# Patient Record
Sex: Female | Born: 1975 | Race: White | Hispanic: No | Marital: Married | Smoking: Former smoker
Health system: Southern US, Community
[De-identification: ages and names within clinical notes are randomized; demographics above are authoritative.]

## PROBLEM LIST (undated history)

## (undated) DIAGNOSIS — J381 Polyp of vocal cord and larynx: Secondary | ICD-10-CM

## (undated) DIAGNOSIS — K219 Gastro-esophageal reflux disease without esophagitis: Secondary | ICD-10-CM

## (undated) DIAGNOSIS — T1490XA Injury, unspecified, initial encounter: Secondary | ICD-10-CM

## (undated) HISTORY — PX: OTHER SURGICAL HISTORY: SHX169

---

## 2002-12-24 DIAGNOSIS — K219 Gastro-esophageal reflux disease without esophagitis: Secondary | ICD-10-CM | POA: Insufficient documentation

## 2010-03-18 ENCOUNTER — Emergency Department (HOSPITAL_COMMUNITY): Admission: EM | Admit: 2010-03-18 | Discharge: 2010-03-18 | Payer: Self-pay | Admitting: Emergency Medicine

## 2010-11-12 LAB — CBC
HCT: 36.5 % (ref 36.0–46.0)
Hemoglobin: 12.1 g/dL (ref 12.0–15.0)
MCH: 30.7 pg (ref 26.0–34.0)
MCHC: 33.3 g/dL (ref 30.0–36.0)
MCV: 92.4 fL (ref 78.0–100.0)
Platelets: 388 K/uL (ref 150–400)
RBC: 3.95 MIL/uL (ref 3.87–5.11)
RDW: 13.4 % (ref 11.5–15.5)
WBC: 15.4 K/uL — ABNORMAL HIGH (ref 4.0–10.5)

## 2010-11-12 LAB — URINALYSIS, ROUTINE W REFLEX MICROSCOPIC
Glucose, UA: NEGATIVE mg/dL
Hgb urine dipstick: NEGATIVE
Protein, ur: NEGATIVE mg/dL
Specific Gravity, Urine: 1.015 (ref 1.005–1.030)
Urobilinogen, UA: 0.2 mg/dL (ref 0.0–1.0)

## 2010-11-12 LAB — DIFFERENTIAL
Basophils Absolute: 0.1 10*3/uL (ref 0.0–0.1)
Lymphocytes Relative: 22 % (ref 12–46)
Lymphs Abs: 3.4 10*3/uL (ref 0.7–4.0)
Neutro Abs: 10.9 10*3/uL — ABNORMAL HIGH (ref 1.7–7.7)

## 2010-11-12 LAB — BASIC METABOLIC PANEL
BUN: 7 mg/dL (ref 6–23)
Creatinine, Ser: 0.6 mg/dL (ref 0.4–1.2)
GFR calc Af Amer: >60 mL/min (ref >60)
GFR calc non Af Amer: >60 mL/min (ref >60)
Potassium: 3.5 mEq/L (ref 3.5–5.1)

## 2010-11-12 LAB — POCT PREGNANCY, URINE: Preg Test, Ur: NEGATIVE

## 2013-02-04 ENCOUNTER — Emergency Department (HOSPITAL_COMMUNITY): Payer: BC Managed Care – PPO

## 2013-02-04 ENCOUNTER — Encounter (HOSPITAL_COMMUNITY): Payer: Self-pay

## 2013-02-04 ENCOUNTER — Emergency Department (HOSPITAL_COMMUNITY)
Admission: EM | Admit: 2013-02-04 | Discharge: 2013-02-04 | Disposition: A | Discharge status: Home / Self Care | Payer: BC Managed Care – PPO | Attending: Emergency Medicine | Admitting: Emergency Medicine

## 2013-02-04 DIAGNOSIS — S61209A Unspecified open wound of unspecified finger without damage to nail, initial encounter: Secondary | ICD-10-CM | POA: Insufficient documentation

## 2013-02-04 DIAGNOSIS — Y9289 Other specified places as the place of occurrence of the external cause: Secondary | ICD-10-CM | POA: Insufficient documentation

## 2013-02-04 DIAGNOSIS — F17200 Nicotine dependence, unspecified, uncomplicated: Secondary | ICD-10-CM | POA: Insufficient documentation

## 2013-02-04 DIAGNOSIS — Z79899 Other long term (current) drug therapy: Secondary | ICD-10-CM | POA: Insufficient documentation

## 2013-02-04 DIAGNOSIS — W5311XA Bitten by rat, initial encounter: Secondary | ICD-10-CM | POA: Insufficient documentation

## 2013-02-04 DIAGNOSIS — S61259A Open bite of unspecified finger without damage to nail, initial encounter: Secondary | ICD-10-CM

## 2013-02-04 DIAGNOSIS — R209 Unspecified disturbances of skin sensation: Secondary | ICD-10-CM | POA: Insufficient documentation

## 2013-02-04 DIAGNOSIS — Z8709 Personal history of other diseases of the respiratory system: Secondary | ICD-10-CM | POA: Insufficient documentation

## 2013-02-04 DIAGNOSIS — K219 Gastro-esophageal reflux disease without esophagitis: Secondary | ICD-10-CM | POA: Insufficient documentation

## 2013-02-04 DIAGNOSIS — IMO0002 Reserved for concepts with insufficient information to code with codable children: Secondary | ICD-10-CM | POA: Insufficient documentation

## 2013-02-04 DIAGNOSIS — Y9389 Activity, other specified: Secondary | ICD-10-CM | POA: Insufficient documentation

## 2013-02-04 DIAGNOSIS — W540XXA Bitten by dog, initial encounter: Secondary | ICD-10-CM | POA: Insufficient documentation

## 2013-02-04 DIAGNOSIS — W540XXD Bitten by dog, subsequent encounter: Secondary | ICD-10-CM

## 2013-02-04 DIAGNOSIS — S51809A Unspecified open wound of unspecified forearm, initial encounter: Secondary | ICD-10-CM | POA: Insufficient documentation

## 2013-02-04 HISTORY — DX: Gastro-esophageal reflux disease without esophagitis: K21.9

## 2013-02-04 HISTORY — DX: Polyp of vocal cord and larynx: J38.1

## 2013-02-04 MED ORDER — OXYCODONE-ACETAMINOPHEN 5-325 MG PO TABS: 1.0000 | ORAL_TABLET | ORAL | Status: DC | PRN

## 2013-02-04 MED ORDER — LIDOCAINE HCL (PF) 2 % IJ SOLN
10.0000 mL | Freq: Once | INTRAMUSCULAR | Status: AC
  Administered 2013-02-04: 10 mL
  Filled 2013-02-04: qty 10

## 2013-02-04 MED ORDER — IBUPROFEN 800 MG PO TABS
800.0000 mg | ORAL_TABLET | Freq: Once | ORAL | Status: AC
  Administered 2013-02-04: 800 mg via ORAL
  Filled 2013-02-04: qty 1

## 2013-02-04 MED ORDER — OXYCODONE-ACETAMINOPHEN 5-325 MG PO TABS
ORAL_TABLET | ORAL | Status: AC
  Administered 2013-02-04: 1
  Filled 2013-02-04: qty 1

## 2013-02-04 MED ORDER — AMOXICILLIN-POT CLAVULANATE 875-125 MG PO TABS
1.0000 | ORAL_TABLET | Freq: Once | ORAL | Status: AC
  Administered 2013-02-04: 1 via ORAL
  Filled 2013-02-04: qty 1

## 2013-02-04 MED ORDER — SODIUM CHLORIDE 0.9 % IV SOLN
3.0000 g | Freq: Once | INTRAVENOUS | Status: AC
  Administered 2013-02-04: 3 g via INTRAVENOUS
  Filled 2013-02-04: qty 3

## 2013-02-04 MED ORDER — AMOXICILLIN-POT CLAVULANATE 875-125 MG PO TABS: 1.0000 | ORAL_TABLET | Freq: Two times a day (BID) | ORAL | Status: DC

## 2013-02-04 MED ORDER — OXYCODONE-ACETAMINOPHEN 5-325 MG PO TABS
1.0000 | ORAL_TABLET | Freq: Once | ORAL | Status: AC
  Administered 2013-02-04: 1 via ORAL
  Filled 2013-02-04: qty 1

## 2013-02-04 NOTE — ED Notes (Signed)
Was here earlier today for a dog bite, and had 3 grams of Unasyn here IV and 2 doses of Augmentin PO at home. Now my left arm is swelling real bad, pain is not getting better. Having numbness in all of my fingers per pt.

## 2013-02-04 NOTE — ED Notes (Signed)
Pt alert & oriented x4, stable gait. Patient  given discharge instructions, paperwork & prescription(s). Patient verbalized understanding. Pt left department w/ no further questions. 

## 2013-02-04 NOTE — ED Notes (Signed)
Pt was breaking up her dogs from fighting over food. Dog bite to left forearm, and right arm, abrasion to left knee

## 2013-02-04 NOTE — ED Notes (Signed)
Wound irrigation x3 liters normal saline started.

## 2013-02-04 NOTE — ED Provider Notes (Signed)
History  This chart was scribed for Raeford Razor, MD by Greggory Stallion, ED Scribe. This patient was seen in room APA08/[facility]08 and the patient's care was started at 10:02 PM.  CSN: [id number]  Arrival date & time 02/04/13  2053    Chief Complaint  Patient presents with  . Arm Injury  . Numbness    The history is provided by the patient. No language interpreter was used.    HPI Comments: Brittney Fitzgerald is a 37 y.o. female who presents to the Emergency Department complaining of left arm injury that happened earlier today when she was drying to break up her dogs fighting. She states it is starting to swell pretty bad. Pt states she is feeling numbness in all of her fingers. She states she took some pain medication with little relief. Pt denies fever, neck pain, sore throat, visual disturbance, CP, cough, SOB, abdominal pain, nausea, emesis, diarrhea, urinary symptoms, back pain, HA, weakness, numbness and rash as associated symptoms. Pt states she does not need any pain medication.   Past Medical History  Diagnosis Date  . GERD (gastroesophageal reflux disease)   . Vocal cord polyp     Past Surgical History  Procedure Laterality Date  . Vocal cord polyp removal    . Skull fracture repair      No family history on file.  History  Substance Use Topics  . Smoking status: Current Every Day Smoker  . Smokeless tobacco: Not on file  . Alcohol Use: Yes    OB History   Grav Para Term Preterm Abortions TAB SAB Ect Mult Living                  Review of Systems  HENT: Negative for sore throat.   Eyes: Negative for visual disturbance.  Respiratory: Negative for cough and shortness of breath.   Cardiovascular: Negative for chest pain.  Gastrointestinal: Negative for nausea, vomiting, abdominal pain and diarrhea.  Genitourinary: Negative for dysuria and difficulty urinating.  Skin: Negative for rash.  Neurological: Negative for headaches.  All other systems reviewed and are  negative.    Allergies  Review of patient's allergies indicates no known allergies.  Home Medications   Current Outpatient Rx  Name  Route  Sig  Dispense  Refill  . fluticasone (FLONASE) 50 MCG/ACT nasal spray   Nasal   Place 2 sprays into the nose daily.         Marland Kitchen omeprazole (PRILOSEC) 20 MG capsule   Oral   Take 20 mg by mouth daily.         Marland Kitchen amoxicillin-clavulanate (AUGMENTIN) 875-125 MG per tablet   Oral   Take 1 tablet by mouth every 12 (twelve) hours.   20 tablet   0   . oxyCODONE-acetaminophen (PERCOCET/ROXICET) 5-325 MG per tablet   Oral   Take 1 tablet by mouth every 4 (four) hours as needed for pain.   20 tablet   0     BP 149/98  Pulse 95  Temp(Src) 98.3 F (36.8 C) (Oral)  Ht 5\' 4"  (1.626 m)  Wt 145 lb (65.772 kg)  BMI 24.88 kg/m2  SpO2 100%  LMP 01/22/2013  Physical Exam  Nursing note and vitals reviewed. Constitutional: She appears well-developed and well-nourished. No distress.  HENT:  Head: Normocephalic and atraumatic.  Right Ear: External ear normal.  Left Ear: External ear normal.  Eyes: Conjunctivae are normal. Right eye exhibits no discharge. Left eye exhibits no discharge. No scleral icterus.  Neck: Neck supple. No tracheal deviation present.  Cardiovascular: Normal rate, regular rhythm and intact distal pulses.   Pulmonary/Chest: Effort normal and breath sounds normal. No stridor. No respiratory distress. She has no wheezes. She has no rales.  Abdominal: Soft. Bowel sounds are normal. She exhibits no distension. There is no tenderness. There is no rebound and no guarding.  Musculoskeletal: She exhibits no edema and no tenderness.  Neurological: She is alert. She has normal strength. No sensory deficit. Cranial nerve deficit:  no gross defecits noted. She exhibits normal muscle tone. She displays no seizure activity. Coordination normal.  Skin: Skin is warm and dry. No rash noted.  Multiple wounds and abrasions to mid left forearm  consistent with described mechanism. Mild surrounding edema. Compartments feel soft. Radial, ulna, and medium intact distally. Good radial and ulnar pulses. Able to actively range her wrist without significant pain.   Psychiatric: She has a normal mood and affect.    ED Course  Procedures (including critical care time)  DIAGNOSTIC STUDIES: Oxygen Saturation is 100% on RA, normal by my interpretation.    COORDINATION OF CARE: 10:06 PM-Discussed treatment plan with pt at bedside and pt agreed to plan.   Labs Reviewed - No data to display Dg Forearm Left  02/04/2013   *RADIOLOGY REPORT*  Clinical Data: Pain post trauma  LEFT FOREARM - 2 VIEW  Comparison: None.  Findings: Until and lateral views were obtained.  There is evidence of soft tissue injury lateral and volar to the mid radius.  There are tiny radiopaque foreign bodies in the area of trauma.  There is no fracture or dislocation.  No abnormal periosteal reaction.  IMPRESSION: Soft tissue injury with tiny radiopaque foreign bodies in the area of soft tissue injury.  No fracture or dislocation.  No erosive change or bony destruction.   Original Report Authenticated By: Bretta Bang, M.D.   Dg Hand Complete Left  02/04/2013   *RADIOLOGY REPORT*  Clinical Data:   pain post trauma  LEFT HAND - COMPLETE 3+ VIEW  Comparison: None.  Findings: Frontal, oblique, lateral views were obtained.  Dye there is no fracture or dislocation.  There is slight swelling of the fourth digit.  No radiopaque foreign body.  No erosive change or soft tissue air.  Joint spaces appear intact.  IMPRESSION: Mild swelling fourth digit.  Study otherwise unremarkable.   Original Report Authenticated By: Bretta Bang, M.D.     1. Dog bite, subsequent encounter       MDM  36yF with continued pain and swelling after dog bite to forearm. Doubt compartment syndrome. Suspect continued symptoms/normal physiologic response to injury. Continue PRN pain meds, elevation,  abx.        I personally preformed the services scribed in my presence. The recorded information has been reviewed is accurate. Raeford Razor, MD.     Raeford Razor, MD 02/11/13 [phone]

## 2013-02-04 NOTE — ED Provider Notes (Signed)
Pt relates her dogs (50 pounds) were fighting and she tried to break it up. Has a large semicular laceration on the dorsum of her left forearm, has puncture wounds on her medial right knee and on her hands.   Medical screening examination/treatment/procedure(s) were conducted as a shared visit with non-physician practitioner(s) and myself.  I personally evaluated the patient during the encounter  Devoria Albe, MD, Franz Dell, MD 02/04/13 [phone]

## 2013-02-05 ENCOUNTER — Other Ambulatory Visit: Payer: Self-pay | Admitting: Orthopedic Surgery

## 2013-02-05 ENCOUNTER — Encounter (HOSPITAL_COMMUNITY)
Admission: RE | Admit: 2013-02-05 | Discharge: 2013-02-05 | Disposition: A | Discharge status: Home / Self Care | Payer: BC Managed Care – PPO | Source: Ambulatory Visit | Attending: Orthopedic Surgery | Admitting: Orthopedic Surgery

## 2013-02-05 ENCOUNTER — Encounter (HOSPITAL_COMMUNITY): Payer: Self-pay

## 2013-02-05 DIAGNOSIS — B999 Unspecified infectious disease: Secondary | ICD-10-CM | POA: Insufficient documentation

## 2013-02-05 MED ORDER — SODIUM CHLORIDE 0.9 % IV SOLN
3.0000 g | Freq: Four times a day (QID) | INTRAVENOUS | Status: AC
  Administered 2013-02-05: 3 g via INTRAVENOUS
  Filled 2013-02-05: qty 3

## 2013-02-05 MED ORDER — SODIUM CHLORIDE 0.9 % IV SOLN
Freq: Once | INTRAVENOUS | Status: AC
  Administered 2013-02-05: 13:00:00 via INTRAVENOUS

## 2013-02-06 ENCOUNTER — Encounter (HOSPITAL_COMMUNITY): Payer: Self-pay

## 2013-02-06 ENCOUNTER — Other Ambulatory Visit: Payer: Self-pay | Admitting: Orthopedic Surgery

## 2013-02-06 ENCOUNTER — Ambulatory Visit (HOSPITAL_COMMUNITY)
Admission: RE | Admit: 2013-02-06 | Discharge: 2013-02-06 | Disposition: A | Discharge status: Home / Self Care | Payer: BC Managed Care – PPO | Source: Ambulatory Visit | Attending: Orthopedic Surgery | Admitting: Orthopedic Surgery

## 2013-02-06 DIAGNOSIS — B999 Unspecified infectious disease: Secondary | ICD-10-CM | POA: Insufficient documentation

## 2013-02-06 HISTORY — DX: Injury, unspecified, initial encounter: T14.90XA

## 2013-02-06 MED ORDER — SODIUM CHLORIDE 0.9 % IV SOLN
Freq: Once | INTRAVENOUS | Status: AC
  Administered 2013-02-06: 12:00:00 via INTRAVENOUS

## 2013-02-06 MED ORDER — SODIUM CHLORIDE 0.9 % IV SOLN
3.0000 g | Freq: Four times a day (QID) | INTRAVENOUS | Status: AC
  Administered 2013-02-06: 3 g via INTRAVENOUS
  Filled 2013-02-06: qty 3

## 2013-02-07 ENCOUNTER — Ambulatory Visit (HOSPITAL_COMMUNITY)
Admission: RE | Admit: 2013-02-07 | Discharge: 2013-02-07 | Disposition: A | Discharge status: Home / Self Care | Payer: BC Managed Care – PPO | Source: Ambulatory Visit | Attending: Orthopedic Surgery | Admitting: Orthopedic Surgery

## 2013-02-07 ENCOUNTER — Encounter (HOSPITAL_COMMUNITY): Payer: Self-pay

## 2013-02-07 DIAGNOSIS — B999 Unspecified infectious disease: Secondary | ICD-10-CM | POA: Insufficient documentation

## 2013-02-07 MED ORDER — SODIUM CHLORIDE 0.9 % IV SOLN
3.0000 g | Freq: Once | INTRAVENOUS | Status: AC
  Administered 2013-02-07: 3 g via INTRAVENOUS
  Filled 2013-02-07: qty 3

## 2013-02-16 NOTE — ED Provider Notes (Signed)
History     CSN: [id number]  Arrival date & time 02/04/13  1040   First MD Initiated Contact with Patient 02/04/13 1126      Chief Complaint  Patient presents with  . Animal Bite    (Consider location/radiation/quality/duration/timing/severity/associated sxs/prior treatment) HPI Comments: Brittney Fitzgerald is a 37 y.o. Female presenting for evaluation of multiple dog bite punctures and laceration which occurred just prior to arrival as she was trying to break them up from fighting.  She has a few small abrasions on her right hand and fingers, but her left hand and forearm if the primary area of injury.  She has one open gaping laceration on her dorsal left mid forearm which is now hemostatic after gentle pressure was applied.  The remainder of her injuries on her forearm are both dorsal and volar and more consistent with punctures.  She has pain that radiates into her fingers, and she reports pain and swelling which is worse on the floor aspect of her forearm.  She is taking no medications prior to arrival this morning.  Her tetanus status is current and her dogs are fully immunized including rabies.   The history is provided by the patient.    Past Medical History  Diagnosis Date  . GERD (gastroesophageal reflux disease)   . Vocal cord polyp   . Trauma     multiple dog bites    Past Surgical History  Procedure Laterality Date  . Vocal cord polyp removal    . Skull fracture repair      No family history on file.  History  Substance Use Topics  . Smoking status: Current Every Day Smoker  . Smokeless tobacco: Not on file  . Alcohol Use: Yes    OB History   Grav Para Term Preterm Abortions TAB SAB Ect Mult Living                  Review of Systems  Constitutional: Negative for fever and chills.  Respiratory: Negative.   Cardiovascular: Negative.   Gastrointestinal: Negative.   Musculoskeletal: Positive for arthralgias. Negative for myalgias and joint swelling.   Skin: Positive for wound.  Neurological: Negative for weakness and numbness.    Allergies  Review of patient's allergies indicates no known allergies.  Home Medications   Current Outpatient Rx  Name  Route  Sig  Dispense  Refill  . fluticasone (FLONASE) 50 MCG/ACT nasal spray   Nasal   Place 2 sprays into the nose daily.         Marland Kitchen omeprazole (PRILOSEC) 20 MG capsule   Oral   Take 20 mg by mouth daily.         Marland Kitchen amoxicillin-clavulanate (AUGMENTIN) 875-125 MG per tablet   Oral   Take 1 tablet by mouth every 12 (twelve) hours.   20 tablet   0   . oxyCODONE-acetaminophen (PERCOCET/ROXICET) 5-325 MG per tablet   Oral   Take 1 tablet by mouth every 4 (four) hours as needed for pain.   20 tablet   0     BP 149/95  Pulse 112  Temp(Src) 98.2 F (36.8 C) (Oral)  Resp 20  Ht 5\' 4"  (1.626 m)  Wt 145 lb (65.772 kg)  BMI 24.88 kg/m2  SpO2 99%  LMP 01/22/2013  Physical Exam  Constitutional: She is oriented to person, place, and time. She appears well-developed and well-nourished.  HENT:  Head: Normocephalic and atraumatic.  Eyes: Conjunctivae are normal.  Neck: Normal range  of motion.  Cardiovascular: Normal rate.   Pulmonary/Chest: Effort normal.  Musculoskeletal: She exhibits tenderness.  Tender to palpation left mid forearm.  Neurological: She is alert and oriented to person, place, and time. No sensory deficit.  Skin: Laceration noted.  Multiple  small abrasions right hand.  Left forearm displays multiple puncture wounds on both the volar and dorsal surface of the mid forearm.  She has a 2 cm gaping laceration dorsal midforearm which is hemostatic, subcutaneous with no visible underlying injured structures.  Forearm is tender to palpation, mild edema noted around the punctures on the volar surface, forearm feels soft.  She has equal grip strength bilaterally.  Radial and ulnar pulses are intact bilaterally.  Less than 3 second cap refill.  She can wiggle her fingers  and flex and extend her wrist with minimal discomfort.      ED Course  Procedures (including critical care time)  LACERATION REPAIR Performed by: Burgess Amor Authorized by: Burgess Amor Consent: Verbal consent obtained. Risks and benefits: risks, benefits and alternatives were discussed Consent given by: patient Patient identity confirmed: provided demographic data Prepped and Draped in normal sterile fashion Wound explored  Laceration Location: left dorsal forearm  Laceration Length: 2cm  No Foreign Bodies seen or palpated  Anesthesia: local infiltration  Local anesthetic: lidocaine 2% without epinephrine  Anesthetic total: 2 ml  Irrigation method: syringe Amount of cleaning: standard  Skin closure: chromic 4-0  Number of sutures: 1  Technique: One single very loosely applied suture to better approximate the wound edges   Patient tolerance: Patient tolerated the procedure well with no immediate complications.    Labs Reviewed - No data to display No results found.   1. Animal bite of finger, initial encounter       MDM  Patient was discussed with Dr. Lynelle Doctor who also saw patient during this visit.  Spoke with Dr. Cheree Ditto and who recommended Unasyn 3 g IV prior to patient discharge home.  Also recommended copious irrigation of her wounds which was accomplished by nursing using 3 full liters of saline at her wound sites.  She was prescribed   Augmentin and prescribed oxycodone for pain relief.  She will follow up with Dr. Cheree Ditto tomorrow for a wound recheck in his office.  She was advised to return here in the interim for any worsened symptoms.    Burgess Amor, PA-C 02/16/13 1755

## 2013-02-16 NOTE — ED Provider Notes (Signed)
See prior note   Ward Givens, MD 02/16/13 2130

## 2015-01-17 IMAGING — CR DG FOREARM 2V*L*
2 series · 2 of 2 positions shown · non-contrast
Comparison: None.

CLINICAL DATA: Pain post trauma

LEFT FOREARM - 2 VIEW

[view not recorded (1 of 2)]
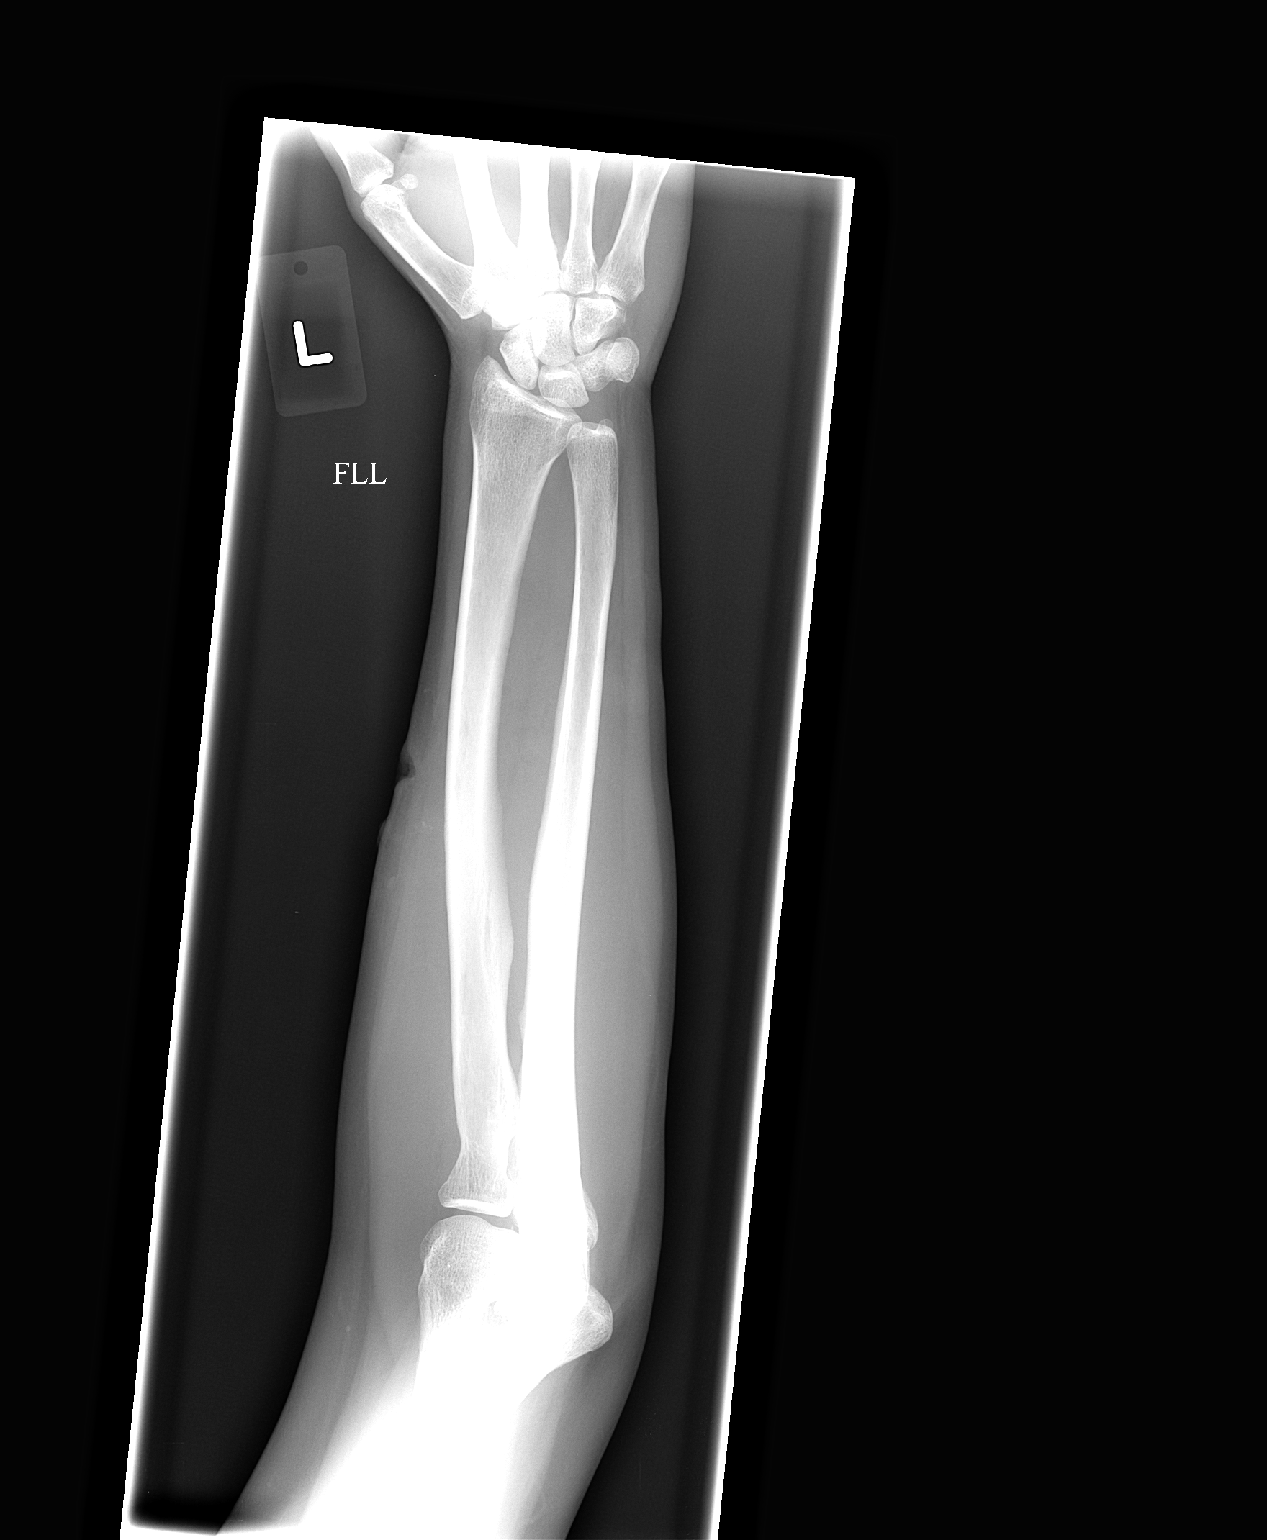

[view not recorded (2 of 2)]
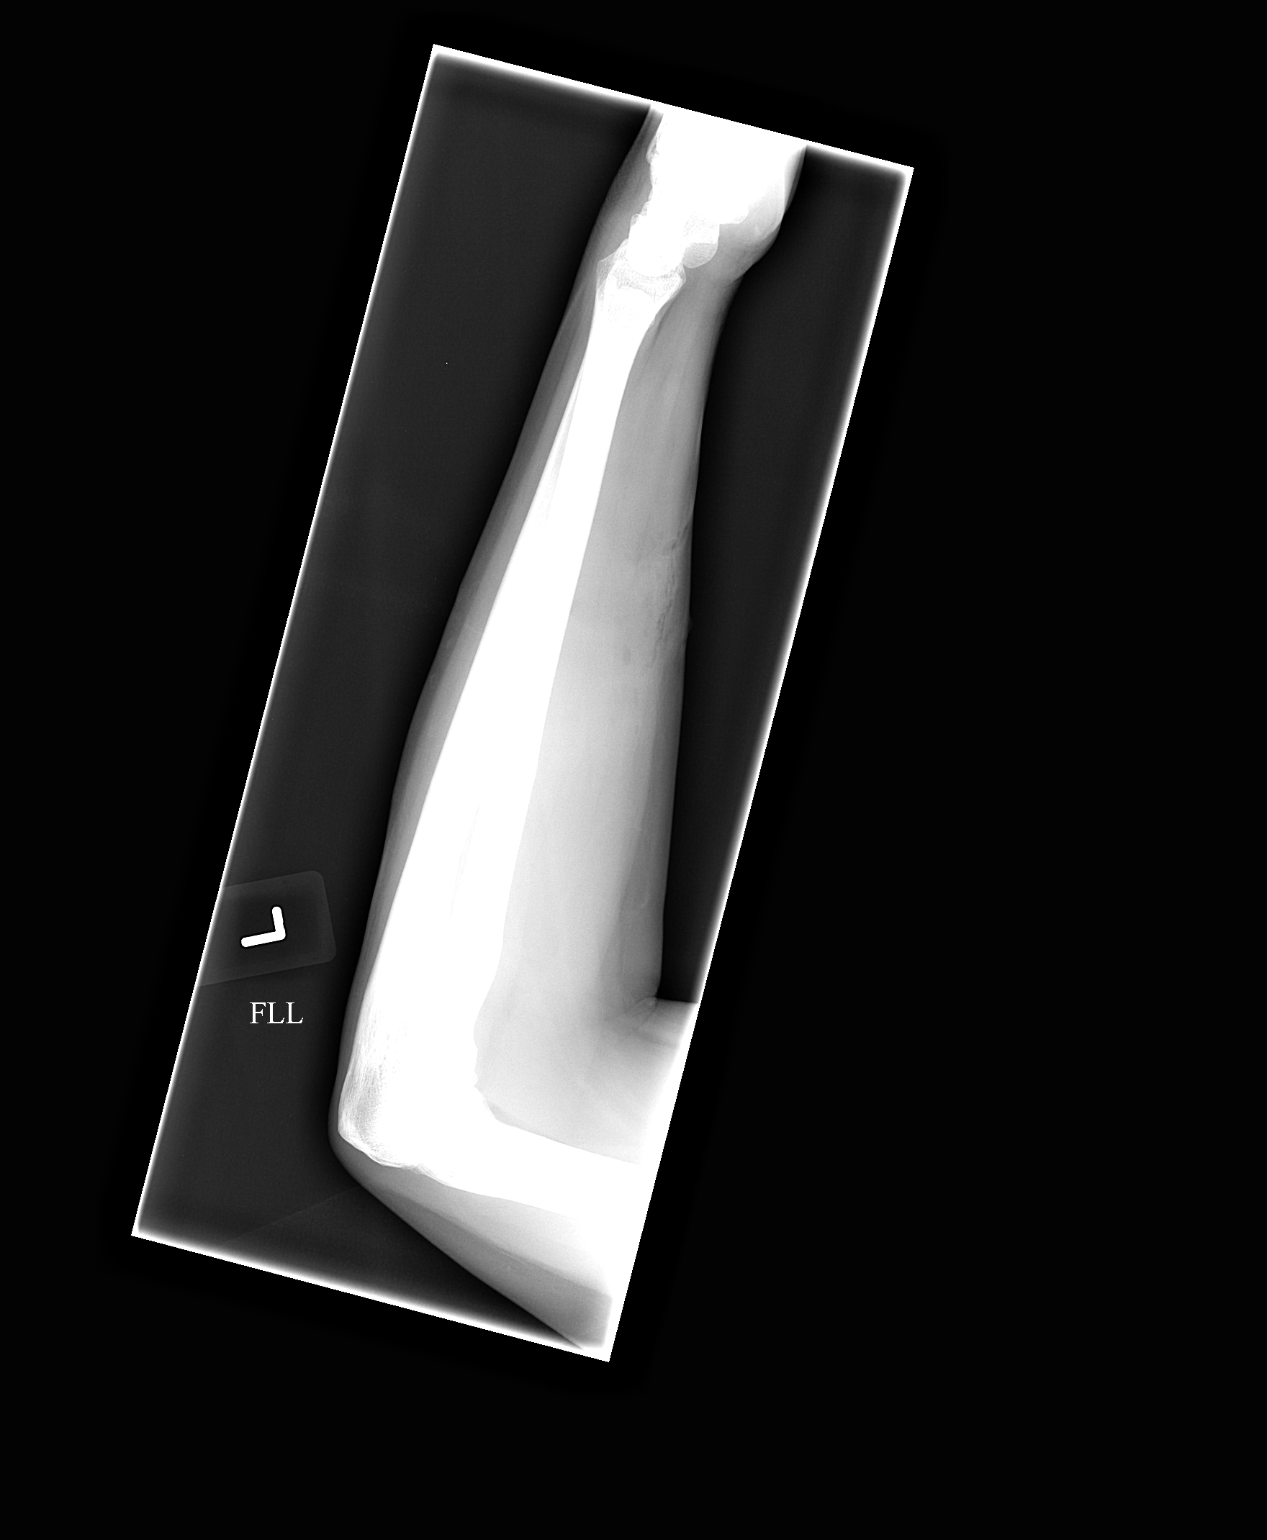

[2 of 2 positions shown; findings below may reference images not displayed]

FINDINGS: Until and lateral views were obtained.  There is evidence
of soft tissue injury lateral and volar to the mid radius.  There
are tiny radiopaque foreign bodies in the area of trauma.

There is no fracture or dislocation.  No abnormal periosteal
reaction.
IMPRESSION: Soft tissue injury with tiny radiopaque foreign bodies
in the area of soft tissue injury.  No fracture or dislocation.  No
erosive change or bony destruction.

## 2015-01-17 IMAGING — CR DG HAND COMPLETE 3+V*L*
3 series · 3 of 3 positions shown · non-contrast
Comparison: None.

CLINICAL DATA: pain post trauma

LEFT HAND - COMPLETE 3+ VIEW

[view not recorded (1 of 3)]
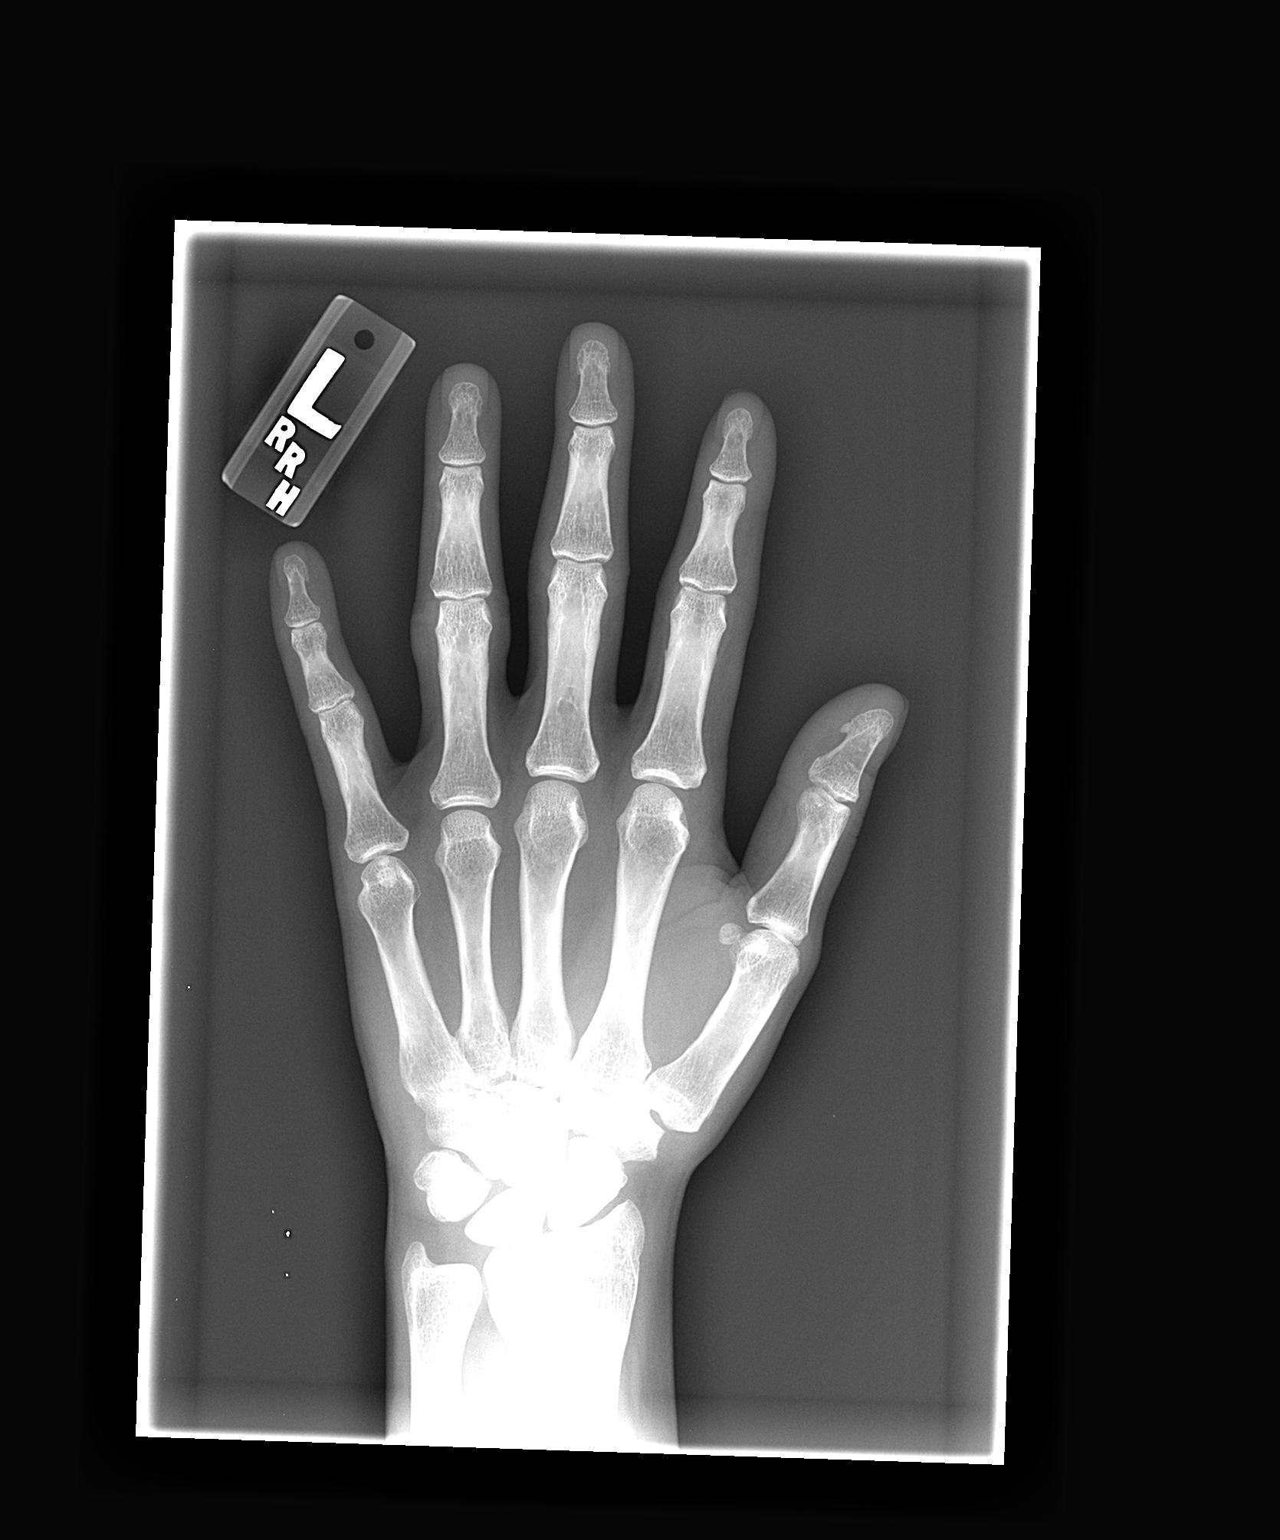

[view not recorded (2 of 3)]
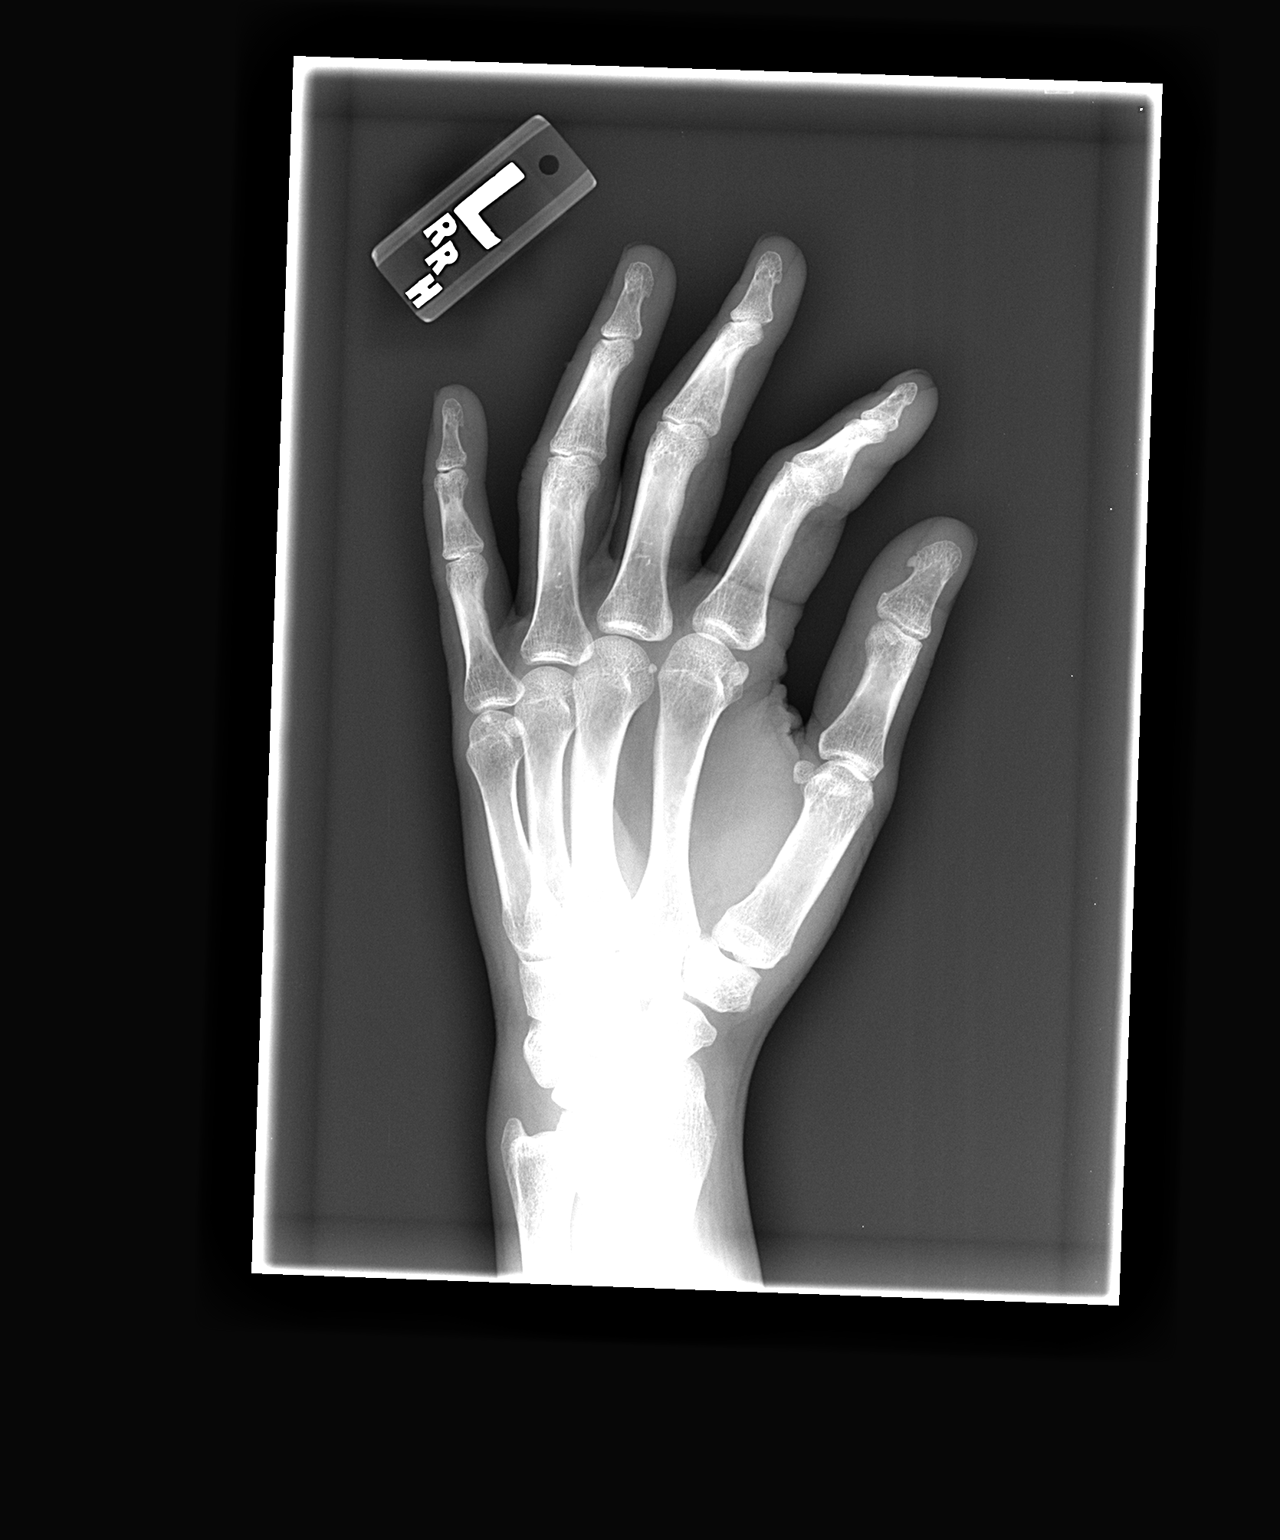

[view not recorded (3 of 3)]
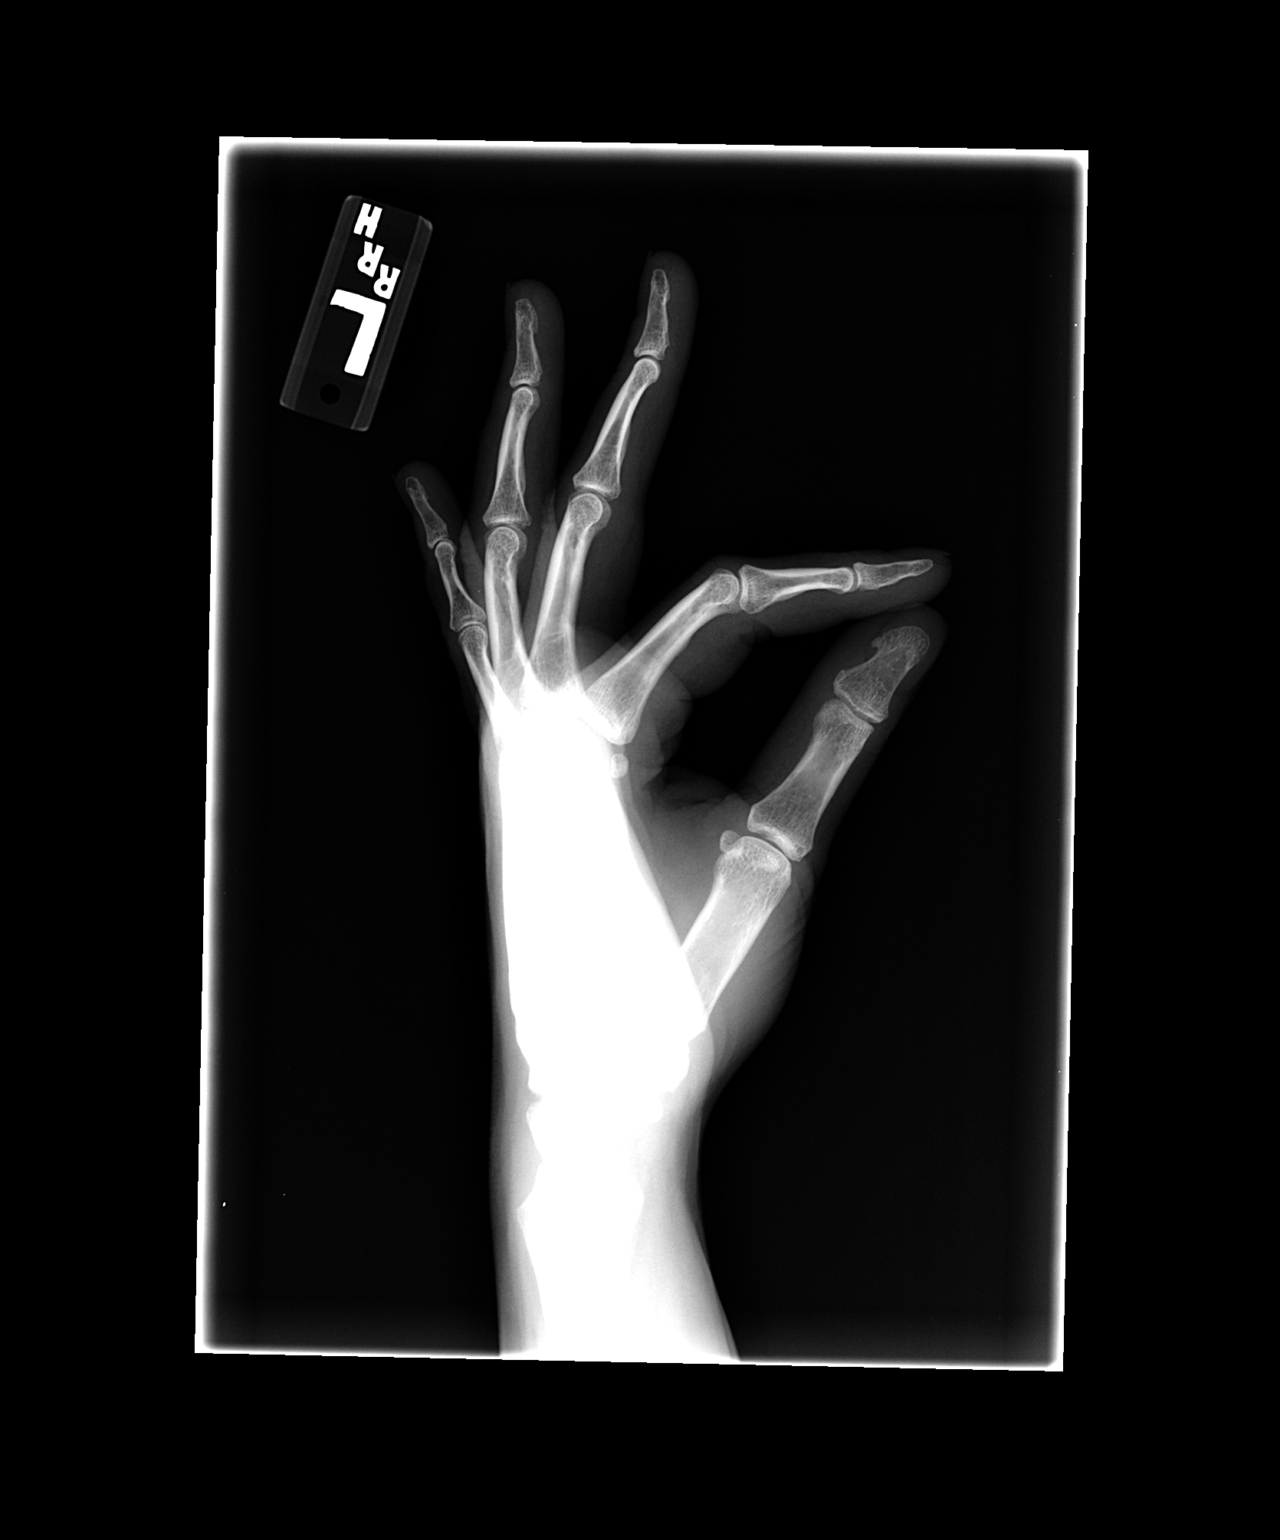

[3 of 3 positions shown; findings below may reference images not displayed]

FINDINGS: Frontal, oblique, lateral views were obtained.  Dye there
is no fracture or dislocation.  There is slight swelling of the
fourth digit.  No radiopaque foreign body.  No erosive change or
soft tissue air.  Joint spaces appear intact.
IMPRESSION: Mild swelling fourth digit.  Study otherwise unremarkable.

## 2016-11-10 ENCOUNTER — Other Ambulatory Visit (HOSPITAL_COMMUNITY)
Admission: RE | Admit: 2016-11-10 | Discharge: 2016-11-10 | Disposition: A | Discharge status: Home / Self Care | Payer: BLUE CROSS/BLUE SHIELD | Source: Ambulatory Visit | Attending: Neurosurgery | Admitting: Neurosurgery

## 2016-11-10 DIAGNOSIS — Z0182 Encounter for allergy testing: Secondary | ICD-10-CM | POA: Diagnosis present

## 2016-11-10 DIAGNOSIS — M5023 Other cervical disc displacement, cervicothoracic region: Secondary | ICD-10-CM | POA: Insufficient documentation

## 2016-11-10 LAB — BASIC METABOLIC PANEL
ANION GAP: 7 (ref 5–15)
BUN: 14 mg/dL (ref 6–20)
CALCIUM: 8.8 mg/dL — AB (ref 8.9–10.3)
CO2: 27 mmol/L (ref 22–32)
Chloride: 103 mmol/L (ref 101–111)
Creatinine, Ser: 0.74 mg/dL (ref 0.44–1.00)
GFR calc Af Amer: >60 mL/min (ref >60)
GFR calc non Af Amer: >60 mL/min (ref >60)
GLUCOSE: 87 mg/dL (ref 65–99)
POTASSIUM: 3.9 mmol/L (ref 3.5–5.1)
Sodium: 137 mmol/L (ref 135–145)

## 2016-11-10 LAB — CBC
HEMATOCRIT: 38.6 % (ref 36.0–46.0)
Hemoglobin: 13 g/dL (ref 12.0–15.0)
MCH: 31.6 pg (ref 26.0–34.0)
MCHC: 33.7 g/dL (ref 30.0–36.0)
MCV: 93.7 fL (ref 78.0–100.0)
Platelets: 343 10*3/uL (ref 150–400)
RBC: 4.12 MIL/uL (ref 3.87–5.11)
RDW: 12.8 % (ref 11.5–15.5)
WBC: 9 10*3/uL (ref 4.0–10.5)

## 2019-09-17 ENCOUNTER — Other Ambulatory Visit (HOSPITAL_COMMUNITY): Payer: Self-pay | Admitting: *Deleted

## 2019-09-17 DIAGNOSIS — Z0000 Encounter for general adult medical examination without abnormal findings: Secondary | ICD-10-CM

## 2019-09-17 NOTE — Progress Notes (Signed)
Orders received from Hamorton C. Horner,FNP for labs.  [name] , NP okay to place orders. We will fax results to ordering provider.

## 2019-09-18 ENCOUNTER — Other Ambulatory Visit (HOSPITAL_COMMUNITY): Payer: Self-pay | Admitting: *Deleted

## 2019-09-18 ENCOUNTER — Other Ambulatory Visit (HOSPITAL_COMMUNITY)
Admission: RE | Admit: 2019-09-18 | Discharge: 2019-09-18 | Disposition: A | Discharge status: Home / Self Care | Payer: BC Managed Care – PPO | Source: Ambulatory Visit | Attending: Family | Admitting: Family

## 2019-09-18 DIAGNOSIS — Z0000 Encounter for general adult medical examination without abnormal findings: Secondary | ICD-10-CM | POA: Diagnosis not present

## 2019-09-18 LAB — COMPREHENSIVE METABOLIC PANEL
ALT: 62 U/L — ABNORMAL HIGH (ref 0–44)
AST: 78 U/L — ABNORMAL HIGH (ref 15–41)
Albumin: 4.5 g/dL (ref 3.5–5.0)
Alkaline Phosphatase: 37 U/L — ABNORMAL LOW (ref 38–126)
Anion gap: 9 (ref 5–15)
BUN: 8 mg/dL (ref 6–20)
CO2: 27 mmol/L (ref 22–32)
Calcium: 9.1 mg/dL (ref 8.9–10.3)
Chloride: 102 mmol/L (ref 98–111)
Creatinine, Ser: 0.72 mg/dL (ref 0.44–1.00)
GFR calc Af Amer: >60 mL/min (ref >60)
GFR calc non Af Amer: >60 mL/min (ref >60)
Glucose, Bld: 83 mg/dL (ref 70–99)
Potassium: 3.8 mmol/L (ref 3.5–5.1)
Sodium: 138 mmol/L (ref 135–145)
Total Bilirubin: 1.6 mg/dL — ABNORMAL HIGH (ref 0.3–1.2)
Total Protein: 7.6 g/dL (ref 6.5–8.1)

## 2019-09-18 LAB — CBC
HCT: 42.7 % (ref 36.0–46.0)
Hemoglobin: 14.3 g/dL (ref 12.0–15.0)
MCH: 32.6 pg (ref 26.0–34.0)
MCHC: 33.5 g/dL (ref 30.0–36.0)
MCV: 97.5 fL (ref 80.0–100.0)
Platelets: 308 10*3/uL (ref 150–400)
RBC: 4.38 MIL/uL (ref 3.87–5.11)
RDW: 12.2 % (ref 11.5–15.5)
WBC: 5.1 10*3/uL (ref 4.0–10.5)
nRBC: 0 % (ref 0.0–0.2)

## 2019-09-18 LAB — LIPID PANEL
Cholesterol: 236 mg/dL — ABNORMAL HIGH (ref 0–200)
HDL: 132 mg/dL (ref >40)
LDL Cholesterol: 81 mg/dL (ref 0–99)
Total CHOL/HDL Ratio: 1.8 RATIO
Triglycerides: 113 mg/dL (ref <150)
VLDL: 23 mg/dL (ref 0–40)

## 2019-09-18 LAB — TSH: TSH: 0.73 u[IU]/mL (ref 0.350–4.500)

## 2020-10-06 ENCOUNTER — Other Ambulatory Visit (HOSPITAL_COMMUNITY)
Admission: RE | Admit: 2020-10-06 | Discharge: 2020-10-06 | Disposition: A | Discharge status: Home / Self Care | Payer: BC Managed Care – PPO | Source: Ambulatory Visit | Attending: Family | Admitting: Family

## 2020-10-06 DIAGNOSIS — Z13228 Encounter for screening for other metabolic disorders: Secondary | ICD-10-CM | POA: Diagnosis present

## 2020-10-06 DIAGNOSIS — Z1329 Encounter for screening for other suspected endocrine disorder: Secondary | ICD-10-CM | POA: Insufficient documentation

## 2020-10-06 DIAGNOSIS — R748 Abnormal levels of other serum enzymes: Secondary | ICD-10-CM | POA: Diagnosis present

## 2020-10-06 DIAGNOSIS — Z13220 Encounter for screening for lipoid disorders: Secondary | ICD-10-CM | POA: Diagnosis present

## 2020-10-06 LAB — CBC
HCT: 44.1 % (ref 36.0–46.0)
Hemoglobin: 14.5 g/dL (ref 12.0–15.0)
MCH: 31.7 pg (ref 26.0–34.0)
MCHC: 32.9 g/dL (ref 30.0–36.0)
MCV: 96.3 fL (ref 80.0–100.0)
Platelets: 395 10*3/uL (ref 150–400)
RBC: 4.58 MIL/uL (ref 3.87–5.11)
RDW: 12.9 % (ref 11.5–15.5)
WBC: 5.6 10*3/uL (ref 4.0–10.5)
nRBC: 0 % (ref 0.0–0.2)

## 2020-10-06 LAB — HEPATIC FUNCTION PANEL
ALT: 13 U/L (ref 0–44)
AST: 22 U/L (ref 15–41)
Albumin: 4.6 g/dL (ref 3.5–5.0)
Alkaline Phosphatase: 34 U/L — ABNORMAL LOW (ref 38–126)
Bilirubin, Direct: <0.1 mg/dL (ref 0.0–0.2)
Total Bilirubin: 1.4 mg/dL — ABNORMAL HIGH (ref 0.3–1.2)
Total Protein: 8 g/dL (ref 6.5–8.1)

## 2020-10-06 LAB — BASIC METABOLIC PANEL
Anion gap: 8 (ref 5–15)
BUN: 8 mg/dL (ref 6–20)
CO2: 25 mmol/L (ref 22–32)
Calcium: 8.9 mg/dL (ref 8.9–10.3)
Chloride: 102 mmol/L (ref 98–111)
Creatinine, Ser: 0.62 mg/dL (ref 0.44–1.00)
GFR, Estimated: >60 mL/min (ref >60)
Glucose, Bld: 96 mg/dL (ref 70–99)
Potassium: 3.3 mmol/L — ABNORMAL LOW (ref 3.5–5.1)
Sodium: 135 mmol/L (ref 135–145)

## 2020-10-06 LAB — LIPID PANEL
Cholesterol: 201 mg/dL — ABNORMAL HIGH (ref 0–200)
HDL: 124 mg/dL (ref >40)
LDL Cholesterol: 61 mg/dL (ref 0–99)
Total CHOL/HDL Ratio: 1.6 RATIO
Triglycerides: 80 mg/dL (ref <150)
VLDL: 16 mg/dL (ref 0–40)

## 2020-10-06 LAB — HEPATITIS PANEL, ACUTE
HCV Ab: NONREACTIVE
Hep A IgM: NONREACTIVE
Hep B C IgM: NONREACTIVE
Hepatitis B Surface Ag: NONREACTIVE

## 2020-10-06 LAB — TSH: TSH: 0.769 u[IU]/mL (ref 0.350–4.500)

## 2021-07-05 ENCOUNTER — Ambulatory Visit: Payer: Self-pay

## 2022-10-05 ENCOUNTER — Other Ambulatory Visit (HOSPITAL_COMMUNITY): Payer: Self-pay

## 2022-11-03 ENCOUNTER — Other Ambulatory Visit: Payer: Self-pay

## 2022-11-03 ENCOUNTER — Other Ambulatory Visit (HOSPITAL_COMMUNITY): Payer: Self-pay

## 2022-11-03 DIAGNOSIS — Z01419 Encounter for gynecological examination (general) (routine) without abnormal findings: Secondary | ICD-10-CM | POA: Diagnosis not present

## 2022-11-03 DIAGNOSIS — Z1231 Encounter for screening mammogram for malignant neoplasm of breast: Secondary | ICD-10-CM | POA: Diagnosis not present

## 2022-11-03 DIAGNOSIS — N951 Menopausal and female climacteric states: Secondary | ICD-10-CM | POA: Diagnosis not present

## 2022-11-03 DIAGNOSIS — Z9229 Personal history of other drug therapy: Secondary | ICD-10-CM | POA: Diagnosis not present

## 2022-11-03 DIAGNOSIS — Z719 Counseling, unspecified: Secondary | ICD-10-CM | POA: Diagnosis not present

## 2022-11-03 MED ORDER — NORETHINDRONE ACET-ETHINYL EST 1-20 MG-MCG PO TABS
1.0000 | ORAL_TABLET | Freq: Every day | ORAL | 4 refills | Status: DC
  Filled 2022-11-03 – 2022-11-17 (×5): qty 84, 84d supply, fill #0
  Filled 2023-03-01: qty 84, 84d supply, fill #1
  Filled 2023-07-09: qty 84, 84d supply, fill #2

## 2022-11-06 ENCOUNTER — Other Ambulatory Visit: Payer: Self-pay

## 2022-11-07 ENCOUNTER — Other Ambulatory Visit (HOSPITAL_COMMUNITY): Payer: Self-pay

## 2022-11-07 ENCOUNTER — Other Ambulatory Visit: Payer: Self-pay

## 2022-11-10 DIAGNOSIS — H90A21 Sensorineural hearing loss, unilateral, right ear, with restricted hearing on the contralateral side: Secondary | ICD-10-CM | POA: Diagnosis not present

## 2022-11-10 DIAGNOSIS — H9313 Tinnitus, bilateral: Secondary | ICD-10-CM | POA: Diagnosis not present

## 2022-11-16 ENCOUNTER — Ambulatory Visit: Payer: Commercial Managed Care - PPO | Admitting: Family Medicine

## 2022-11-17 ENCOUNTER — Other Ambulatory Visit: Payer: Self-pay

## 2022-12-08 ENCOUNTER — Other Ambulatory Visit (HOSPITAL_COMMUNITY): Payer: Self-pay

## 2022-12-08 MED ORDER — AZELASTINE HCL 0.1 % NA SOLN
2.0000 | Freq: Two times a day (BID) | NASAL | 5 refills | Status: DC
  Filled 2022-12-08: qty 30, 30d supply, fill #0

## 2022-12-28 ENCOUNTER — Encounter: Payer: Self-pay | Admitting: Family Medicine

## 2022-12-28 ENCOUNTER — Other Ambulatory Visit (HOSPITAL_COMMUNITY): Payer: Self-pay

## 2022-12-28 ENCOUNTER — Ambulatory Visit: Payer: Commercial Managed Care - PPO | Admitting: Family Medicine

## 2022-12-28 ENCOUNTER — Other Ambulatory Visit: Payer: Self-pay

## 2022-12-28 VITALS — BP 134/89 | HR 78 | Temp 98.6°F | Ht 64.0 in | Wt 132.0 lb

## 2022-12-28 DIAGNOSIS — Z13228 Encounter for screening for other metabolic disorders: Secondary | ICD-10-CM

## 2022-12-28 DIAGNOSIS — F5101 Primary insomnia: Secondary | ICD-10-CM | POA: Diagnosis not present

## 2022-12-28 DIAGNOSIS — E559 Vitamin D deficiency, unspecified: Secondary | ICD-10-CM

## 2022-12-28 DIAGNOSIS — Z0001 Encounter for general adult medical examination with abnormal findings: Secondary | ICD-10-CM

## 2022-12-28 DIAGNOSIS — Z130 Encounter for screening for diseases of the blood and blood-forming organs and certain disorders involving the immune mechanism: Secondary | ICD-10-CM

## 2022-12-28 DIAGNOSIS — Z13220 Encounter for screening for lipoid disorders: Secondary | ICD-10-CM

## 2022-12-28 DIAGNOSIS — Z1211 Encounter for screening for malignant neoplasm of colon: Secondary | ICD-10-CM

## 2022-12-28 DIAGNOSIS — Z0000 Encounter for general adult medical examination without abnormal findings: Secondary | ICD-10-CM

## 2022-12-28 MED ORDER — TRAZODONE HCL 50 MG PO TABS
50.0000 mg | ORAL_TABLET | Freq: Every evening | ORAL | 0 refills | Status: DC | PRN
  Filled 2022-12-28: qty 90, 90d supply, fill #0

## 2022-12-28 NOTE — Patient Instructions (Signed)
Labs ordered.  Trazodone as prescribed.  Referral placed.  Take care  Dr. Adriana Simas

## 2022-12-29 ENCOUNTER — Encounter: Payer: Self-pay | Admitting: Internal Medicine

## 2022-12-29 DIAGNOSIS — G4700 Insomnia, unspecified: Secondary | ICD-10-CM | POA: Insufficient documentation

## 2022-12-29 DIAGNOSIS — Z0000 Encounter for general adult medical examination without abnormal findings: Secondary | ICD-10-CM | POA: Insufficient documentation

## 2022-12-29 NOTE — Progress Notes (Signed)
Subjective:  Patient ID: Brittney Fitzgerald, female    DOB: 1975-09-15  Age: 47 y.o. MRN: [id number]  CC: Chief Complaint  Patient presents with   Establish Care    HPI:  47 year old female presents to establish care.  Patient has never really had a primary care provider.  She does have an OB/GYN who takes care of woman's health issues.  Pap smear up-to-date.  Mammogram up-to-date.  Patient is in need of colonoscopy.  She is amenable to this.  Will place referral.  Patient states overall she is doing well.  She does report that she has difficulty sleeping.  She often wakes up in the middle of the night.  She would like to discuss this today.  Patient is unsure of her last tetanus but declines today.  Social Hx   Social History   Socioeconomic History   Marital status: Married    Spouse name: Not on file   Number of children: Not on file   Years of education: Not on file   Highest education level: Associate degree: occupational, Scientist, product/process development, or vocational program  Occupational History   Not on file  Tobacco Use   Smoking status: Former    Years: 25    Types: Cigarettes    Quit date: 12/12/2016    Years since quitting: 6.0   Smokeless tobacco: Never  Substance and Sexual Activity   Alcohol use: Yes   Drug use: No   Sexual activity: Yes    Birth control/protection: None  Other Topics Concern   Not on file  Social History Narrative   Not on file   Social Determinants of Health   Financial Resource Strain: Low Risk  (12/24/2022)   Overall Financial Resource Strain (CARDIA)    Difficulty of Paying Living Expenses: Not very hard  Food Insecurity: No Food Insecurity (12/24/2022)   Hunger Vital Sign    Worried About Running Out of Food in the Last Year: Never true    Ran Out of Food in the Last Year: Never true  Transportation Needs: No Transportation Needs (12/24/2022)   PRAPARE - Administrator, Civil Service (Medical): No    Lack of Transportation  (Non-Medical): No  Physical Activity: Insufficiently Active (12/24/2022)   Exercise Vital Sign    Days of Exercise per Week: 3 days    Minutes of Exercise per Session: 30 min  Stress: Stress Concern Present (12/24/2022)   [employer] of Occupational Health - Occupational Stress Questionnaire    Feeling of Stress : To some extent  Social Connections: Socially Isolated (12/24/2022)   Social Connection and Isolation Panel [NHANES]    Frequency of Communication with Friends and Family: Once a week    Frequency of Social Gatherings with Friends and Family: Once a week    Attends Religious Services: Never    Database administrator or Organizations: No    Attends Engineer, structural: Not on file    Marital Status: Married    Review of Systems  Constitutional: Negative.   Psychiatric/Behavioral:  Positive for sleep disturbance.    Objective:  BP 134/89   Pulse 78   Temp 98.6 F (37 C)   Ht 5\' 4"  (1.626 m)   Wt 132 lb (59.9 kg)   SpO2 99%   BMI 22.66 kg/m      12/28/2022    2:48 PM 02/07/2013   11:31 AM 02/06/2013   12:14 PM  BP/Weight  Systolic BP 134 135 117  Diastolic BP 89 97 75  Wt. (Lbs) 132  145  BMI 22.66 kg/m2  24.89 kg/m2    Physical Exam Vitals and nursing note reviewed.  Constitutional:      General: She is not in acute distress.    Appearance: Normal appearance.  HENT:     Head: Normocephalic and atraumatic.  Eyes:     General:        Right eye: No discharge.        Left eye: No discharge.     Conjunctiva/sclera: Conjunctivae normal.  Cardiovascular:     Rate and Rhythm: Normal rate and regular rhythm.  Pulmonary:     Effort: Pulmonary effort is normal.     Breath sounds: Normal breath sounds. No wheezing, rhonchi or rales.  Neurological:     Mental Status: She is alert.  Psychiatric:        Mood and Affect: Mood normal.        Behavior: Behavior normal.     Lab Results  Component Value Date   WBC 5.6 10/06/2020   HGB 14.5  10/06/2020   HCT 44.1 10/06/2020   PLT 395 10/06/2020   GLUCOSE 96 10/06/2020   CHOL 201 (H) 10/06/2020   TRIG 80 10/06/2020   HDL 124 10/06/2020   LDLCALC 61 10/06/2020   ALT 13 10/06/2020   AST 22 10/06/2020   NA 135 10/06/2020   K 3.3 (L) 10/06/2020   CL 102 10/06/2020   CREATININE 0.62 10/06/2020   BUN 8 10/06/2020   CO2 25 10/06/2020   TSH 0.769 10/06/2020     Assessment & Plan:   Problem List Items Addressed This Visit       Other   Annual physical exam - Primary    Doing well.  Screening labs ordered.  Referral placed for colonoscopy.      Insomnia    We discussed treatment options.  Trial of trazodone.      Other Visit Diagnoses     Encounter for screening colonoscopy       Relevant Orders   Ambulatory referral to Gastroenterology   Screening for deficiency anemia       Relevant Orders   CBC   Screening for metabolic disorder       Relevant Orders   CMP14+EGFR   Vitamin D deficiency       Relevant Orders   Vitamin D, 25-hydroxy   Screening, lipid       Relevant Orders   Lipid panel       Meds ordered this encounter  Medications   traZODone (DESYREL) 50 MG tablet    Sig: Take 1 tablet (50 mg total) by mouth at bedtime as needed for sleep.    Dispense:  90 tablet    Refill:  0    Follow-up:  Return in about 1 year (around 12/28/2023).  Everlene Other DO [facility]

## 2022-12-29 NOTE — Assessment & Plan Note (Signed)
Doing well.  Screening labs ordered.  Referral placed for colonoscopy.

## 2022-12-29 NOTE — Assessment & Plan Note (Signed)
We discussed treatment options.  Trial of trazodone. 

## 2023-01-29 ENCOUNTER — Other Ambulatory Visit: Payer: Self-pay

## 2023-01-29 DIAGNOSIS — Z13220 Encounter for screening for lipoid disorders: Secondary | ICD-10-CM

## 2023-01-29 DIAGNOSIS — Z130 Encounter for screening for diseases of the blood and blood-forming organs and certain disorders involving the immune mechanism: Secondary | ICD-10-CM

## 2023-01-29 DIAGNOSIS — E559 Vitamin D deficiency, unspecified: Secondary | ICD-10-CM

## 2023-01-29 DIAGNOSIS — Z13228 Encounter for screening for other metabolic disorders: Secondary | ICD-10-CM

## 2023-01-30 ENCOUNTER — Other Ambulatory Visit: Payer: Self-pay

## 2023-01-30 DIAGNOSIS — E559 Vitamin D deficiency, unspecified: Secondary | ICD-10-CM

## 2023-01-30 DIAGNOSIS — Z130 Encounter for screening for diseases of the blood and blood-forming organs and certain disorders involving the immune mechanism: Secondary | ICD-10-CM

## 2023-01-30 DIAGNOSIS — Z13220 Encounter for screening for lipoid disorders: Secondary | ICD-10-CM

## 2023-01-30 DIAGNOSIS — Z13228 Encounter for screening for other metabolic disorders: Secondary | ICD-10-CM

## 2023-01-30 NOTE — Addendum Note (Signed)
Addended by: Geradine Girt B on: 01/30/2023 07:58 AM   Modules accepted: Orders

## 2023-02-09 DIAGNOSIS — H524 Presbyopia: Secondary | ICD-10-CM | POA: Diagnosis not present

## 2023-02-09 DIAGNOSIS — H25013 Cortical age-related cataract, bilateral: Secondary | ICD-10-CM | POA: Diagnosis not present

## 2023-03-02 ENCOUNTER — Other Ambulatory Visit: Payer: Self-pay

## 2023-03-23 ENCOUNTER — Other Ambulatory Visit: Payer: Self-pay

## 2023-03-23 ENCOUNTER — Telehealth: Payer: Self-pay

## 2023-03-23 ENCOUNTER — Encounter: Payer: Self-pay | Admitting: Internal Medicine

## 2023-03-23 ENCOUNTER — Ambulatory Visit (AMBULATORY_SURGERY_CENTER): Payer: Commercial Managed Care - PPO

## 2023-03-23 VITALS — Ht 64.0 in | Wt 120.0 lb

## 2023-03-23 DIAGNOSIS — Z1211 Encounter for screening for malignant neoplasm of colon: Secondary | ICD-10-CM

## 2023-03-23 MED ORDER — NA SULFATE-K SULFATE-MG SULF 17.5-3.13-1.6 GM/177ML PO SOLN
1.0000 | Freq: Once | ORAL | 0 refills | Status: AC
Start: 2023-03-23 — End: 2023-03-24
  Filled 2023-03-23: qty 354, 1d supply, fill #0

## 2023-03-23 NOTE — Telephone Encounter (Signed)
PV in progress  

## 2023-03-23 NOTE — Progress Notes (Signed)

## 2023-04-06 ENCOUNTER — Encounter: Payer: Commercial Managed Care - PPO | Admitting: Internal Medicine

## 2023-04-19 ENCOUNTER — Ambulatory Visit (AMBULATORY_SURGERY_CENTER): Payer: Commercial Managed Care - PPO | Admitting: Internal Medicine

## 2023-04-19 ENCOUNTER — Encounter: Payer: Self-pay | Admitting: Internal Medicine

## 2023-04-19 VITALS — BP 183/106 | HR 64 | Temp 98.1°F | Resp 10 | Ht 64.0 in | Wt 120.0 lb

## 2023-04-19 DIAGNOSIS — D123 Benign neoplasm of transverse colon: Secondary | ICD-10-CM | POA: Diagnosis not present

## 2023-04-19 DIAGNOSIS — Z1211 Encounter for screening for malignant neoplasm of colon: Secondary | ICD-10-CM | POA: Diagnosis not present

## 2023-04-19 DIAGNOSIS — D125 Benign neoplasm of sigmoid colon: Secondary | ICD-10-CM

## 2023-04-19 DIAGNOSIS — D120 Benign neoplasm of cecum: Secondary | ICD-10-CM | POA: Diagnosis not present

## 2023-04-19 MED ORDER — SODIUM CHLORIDE 0.9 % IV SOLN: 500.0000 mL | Freq: Once | INTRAVENOUS | Status: DC

## 2023-04-19 NOTE — Progress Notes (Signed)
Report to PACU, RN, vss, BBS= Clear.  

## 2023-04-19 NOTE — Progress Notes (Signed)
1510-pt up after vs complete to BR. 1516-reattach pt to monitor, let Dr Rhea Belton know bright red blood, per Dr Rhea Belton, restart IV for pt to go back into procedure room to look at polyp removal site.  pt IV restarted and explained to pt and then to husband that pt will go back tp procedure room, that her VSS and she is in good hands and she is doing well, husband seated back into lobby.

## 2023-04-19 NOTE — Progress Notes (Signed)
GASTROENTEROLOGY PROCEDURE H&P NOTE   Primary Care Physician: Tommie Sams, DO    Reason for Procedure:  Colon cancer screening  Plan:    Colonoscopy  Patient is appropriate for endoscopic procedure(s) in the ambulatory ([facility]) setting.  The nature of the procedure, as well as the risks, benefits, and alternatives were carefully and thoroughly reviewed with the patient. Ample time for discussion and questions allowed. The patient understood, was satisfied, and agreed to proceed.     HPI: Brittney Fitzgerald is a 47 y.o. female who presents for screening colonoscopy.  Medical history as below.  Tolerated the prep.  No recent chest pain or shortness of breath.  No abdominal pain today.  Past Medical History:  Diagnosis Date   GERD (gastroesophageal reflux disease)    Trauma    multiple dog bites   Vocal cord polyp     Past Surgical History:  Procedure Laterality Date   skull fracture repair     vocal cord polyp removal      Prior to Admission medications   Medication Sig Start Date End Date Taking? Authorizing Provider  azelastine (ASTELIN) 0.1 % nasal spray Place 2 sprays into both nostrils 2 (two) times daily. 12/08/22     fluticasone (FLONASE) 50 MCG/ACT nasal spray Place 2 sprays into the nose daily.    [provider]  norethindrone-ethinyl estradiol (LOESTRIN 1/20, 21,) 1-20 MG-MCG tablet Take 1 tablet by mouth daily - continuous Patient not taking: Reported on 03/23/2023 11/03/22     omeprazole (PRILOSEC) 20 MG capsule Take 20 mg by mouth daily.    [provider]  traZODone (DESYREL) 50 MG tablet Take 1 tablet (50 mg total) by mouth at bedtime as needed for sleep. 12/28/22   Tommie Sams, DO    Current Outpatient Medications  Medication Sig Dispense Refill   azelastine (ASTELIN) 0.1 % nasal spray Place 2 sprays into both nostrils 2 (two) times daily. 30 mL 5   fluticasone (FLONASE) 50 MCG/ACT nasal spray Place 2 sprays into the nose daily.      norethindrone-ethinyl estradiol (LOESTRIN 1/20, 21,) 1-20 MG-MCG tablet Take 1 tablet by mouth daily - continuous (Patient not taking: Reported on 03/23/2023) 84 tablet 4   omeprazole (PRILOSEC) 20 MG capsule Take 20 mg by mouth daily.     traZODone (DESYREL) 50 MG tablet Take 1 tablet (50 mg total) by mouth at bedtime as needed for sleep. 90 tablet 0   Current Facility-Administered Medications  Medication Dose Route Frequency Provider Last Rate Last Admin   0.9 %  sodium chloride infusion  500 mL Intravenous Once Jeanclaude Wentworth, Carie Caddy, MD        Allergies as of 04/19/2023 - Review Complete 03/23/2023  Allergen Reaction Noted   No known allergies  03/07/2023    Family History  Problem Relation Age of Onset   Cancer Mother    Colon cancer Father 61 - 58       GIST tumor in addition to   Heart disease Father    Hypertension Father    Diabetes Father    Cancer Father    Cancer Paternal Uncle    Colon cancer Maternal Grandmother    Esophageal cancer Neg Hx    Rectal cancer Neg Hx    Stomach cancer Neg Hx     Social History   Socioeconomic History   Marital status: Married    Spouse name: Not on file   Number of children: Not on file  Years of education: Not on file   Highest education level: Associate degree: occupational, Scientist, product/process development, or vocational program  Occupational History   Not on file  Tobacco Use   Smoking status: Former    Current packs/day: 0.00    Types: Cigarettes    Start date: 12/13/1991    Quit date: 12/12/2016    Years since quitting: 6.3   Smokeless tobacco: Never  Vaping Use   Vaping status: Never Used  Substance and Sexual Activity   Alcohol use: Yes   Drug use: No   Sexual activity: Yes    Birth control/protection: None  Other Topics Concern   Not on file  Social History Narrative   Not on file   Social Determinants of Health   Financial Resource Strain: Low Risk  (12/24/2022)   Overall Financial Resource Strain (CARDIA)    Difficulty of Paying  Living Expenses: Not very hard  Food Insecurity: No Food Insecurity (12/24/2022)   Hunger Vital Sign    Worried About Running Out of Food in the Last Year: Never true    Ran Out of Food in the Last Year: Never true  Transportation Needs: No Transportation Needs (12/24/2022)   PRAPARE - Administrator, Civil Service (Medical): No    Lack of Transportation (Non-Medical): No  Physical Activity: Insufficiently Active (12/24/2022)   Exercise Vital Sign    Days of Exercise per Week: 3 days    Minutes of Exercise per Session: 30 min  Stress: Stress Concern Present (12/24/2022)   [employer] of Occupational Health - Occupational Stress Questionnaire    Feeling of Stress : To some extent  Social Connections: Socially Isolated (12/24/2022)   Social Connection and Isolation Panel [NHANES]    Frequency of Communication with Friends and Family: Once a week    Frequency of Social Gatherings with Friends and Family: Once a week    Attends Religious Services: Never    Database administrator or Organizations: No    Attends Engineer, structural: Not on file    Marital Status: Married  Catering manager Violence: Not on file    Physical Exam: Vital signs in last 24 hours: @There  were no vitals taken for this visit. GEN: NAD EYE: Sclerae anicteric ENT: MMM CV: Non-tachycardic Pulm: CTA b/l GI: Soft, NT/ND NEURO:  Alert & Oriented x 3   Erick Blinks, MD [facility]  04/19/2023 2:10 PM

## 2023-04-19 NOTE — Progress Notes (Signed)
Called to room to assist during endoscopic procedure.  Patient ID and intended procedure confirmed with present staff. Received instructions for my participation in the procedure from the performing physician.  

## 2023-04-19 NOTE — Progress Notes (Signed)
Pt's states no medical or surgical changes since previsit or office visit. VS assessed by C.W 

## 2023-04-19 NOTE — Patient Instructions (Addendum)
Resume previous diet Continue present medications Await pathology results No NSAIDS (Non-Steroidal anti-inflammatory drugs) for 3 weeks.  (These include, aspirin, aspirin-containing products, ibuprofen, advil, motrin, naproxen, aleve, goody powders, etc) Tylenol is ok to take as needed, see label for instructions. Keep clip card in your wallet, 2 clips should slough off in stool in a few weeks, you probably will not notice this Handouts/information given for polyps, diverticulosis   YOU HAD AN ENDOSCOPIC PROCEDURE TODAY AT THE [facility]:   Refer to the procedure report that was given to you for any specific questions about what was found during the examination.  If the procedure report does not answer your questions, please call your gastroenterologist to clarify.  If you requested that your care partner not be given the details of your procedure findings, then the procedure report has been included in a sealed envelope for you to review at your convenience later.  YOU SHOULD EXPECT: Some feelings of bloating in the abdomen. Passage of more gas than usual.  Walking can help get rid of the air that was put into your GI tract during the procedure and reduce the bloating. If you had a lower endoscopy (such as a colonoscopy or flexible sigmoidoscopy) you may notice spotting of blood in your stool or on the toilet paper. If you underwent a bowel prep for your procedure, you may not have a normal bowel movement for a few days.  Please Note:  You might notice some irritation and congestion in your nose or some drainage.  This is from the oxygen used during your procedure.  There is no need for concern and it should clear up in a day or so.  SYMPTOMS TO REPORT IMMEDIATELY:  Following lower endoscopy (colonoscopy):  Excessive amounts of blood in the stool  Significant tenderness or worsening of abdominal pains  Swelling of the abdomen that is new, acute  Fever of 100F or higher  For  urgent or emergent issues, a gastroenterologist can be reached at any hour by calling [phone]. Do not use MyChart messaging for urgent concerns.   DIET:  We do recommend a small meal at first, but then you may proceed to your regular diet.  Drink plenty of fluids but you should avoid alcoholic beverages for 24 hours.  ACTIVITY:  You should plan to take it easy for the rest of today and you should NOT DRIVE or use heavy machinery until tomorrow (because of the sedation medicines used during the test).    FOLLOW UP: Our staff will call the number listed on your records the next business day following your procedure.  We will call around 7:15- 8:00 am to check on you and address any questions or concerns that you may have regarding the information given to you following your procedure. If we do not reach you, we will leave a message.     If any biopsies were taken you will be contacted by phone or by letter within the next 1-3 weeks.  Please call us at [phone] if you have not heard about the biopsies in 3 weeks.    SIGNATURES/CONFIDENTIALITY: You and/or your care partner have signed paperwork which will be entered into your electronic medical record.  These signatures attest to the fact that that the information above on your After Visit Summary has been reviewed and is understood.  Full responsibility of the confidentiality of this discharge information lies with you and/or your care-partner.

## 2023-04-19 NOTE — Op Note (Addendum)
[facility] Patient Name: Brittney Fitzgerald Procedure Date: 04/19/2023 2:21 PM MRN: [id number] Endoscopist: Beverley Fiedler , MD, [id number] Age: 47 Referring MD:  Date of Birth: 11/02/1975 Gender: Female Account #: [id number] Procedure:                Colonoscopy Indications:              Screening for colorectal malignant neoplasm, This                            is the patient's first colonoscopy Medicines:                Monitored Anesthesia Care Procedure:                Pre-Anesthesia Assessment:                           - Prior to the procedure, a History and Physical                            was performed, and patient medications and                            allergies were reviewed. The patient's tolerance of                            previous anesthesia was also reviewed. The risks                            and benefits of the procedure and the sedation                            options and risks were discussed with the patient.                            All questions were answered, and informed consent                            was obtained. Prior Anticoagulants: The patient has                            taken no anticoagulant or antiplatelet agents. ASA                            Grade Assessment: II - A patient with mild systemic                            disease. After reviewing the risks and benefits,                            the patient was deemed in satisfactory condition to                            undergo the procedure.  After obtaining informed consent, the colonoscope                            was passed under direct vision. Throughout the                            procedure, the patient's blood pressure, pulse, and                            oxygen saturations were monitored continuously. The                            Olympus Scope SN: [id number] was introduced through                            the anus and advanced  to the cecum, identified by                            appendiceal orifice and ileocecal valve. The                            colonoscopy was performed without difficulty. The                            patient tolerated the procedure well. The quality                            of the bowel preparation was good. The ileocecal                            valve, appendiceal orifice, and rectum were                            photographed. Scope In: 2:26:20 PM Scope Out: 2:45:44 PM Scope Withdrawal Time: 0 hours 17 minutes 22 seconds  Total Procedure Duration: 0 hours 19 minutes 24 seconds  Findings:                 The digital rectal exam was normal.                           A 5 mm polyp was found in the cecum. The polyp was                            sessile. The polyp was removed with a cold snare.                            Resection and retrieval were complete.                           An 8 mm polyp was found in the proximal transverse                            colon. The polyp was sessile. The  polyp was removed                            with a cold snare. Resection and retrieval were                            complete.                           A 25 mm polyp was found in the sigmoid colon. The                            polyp was pedunculated. The polyp was removed with                            a hot snare. Resection and retrieval were complete.                            To stop active bleeding, two hemostatic clips were                            successfully placed (MR conditional). There was no                            bleeding at the end of the maneuver.                           Multiple small-mouthed diverticula were found in                            the sigmoid colon, descending colon, transverse                            colon and ascending colon.                           The retroflexed view of the distal rectum and anal                            verge was  normal and showed no anal or rectal                            abnormalities. Complications:            No immediate complications. Estimated Blood Loss:     Estimated blood loss was 250 cc. Impression:               - One 5 mm polyp in the cecum, removed with a cold                            snare. Resected and retrieved.                           - One 8 mm polyp in the proximal transverse colon,  removed with a cold snare. Resected and retrieved.                           - One 25 mm polyp in the sigmoid colon, removed                            with a hot snare. Resected and retrieved. Clips (MR                            conditional) were placed x 2.                           - Mild diverticulosis in the sigmoid colon, in the                            descending colon, in the transverse colon and in                            the ascending colon.                           - The distal rectum and anal verge are normal on                            retroflexion view.                           Addendum: While in recovery patient passed fresh                            red blood without pain. Decision made to return to                            procedure room where relook was performed with the                            pediatric colonoscope. The sigmoid polypectomy site                            was active bleeding (arterial bleeding). Three                            additional hemostatic clips were placed with                            success and cessation of bleeding. Irrigation and                            lavage was performed to remove blood and time spent                            observing the polypectomy site after additional  clip placement. On observation there was no further                            bleeding. Air was removed and then the scope fully                            withdrawn. Vital signs stable  throughout. Recommendation:           - Patient has a contact number available for                            emergencies. The signs and symptoms of potential                            delayed complications were discussed with the                            patient. Return to normal activities tomorrow.                            Written discharge instructions were provided to the                            patient.                           - Resume previous diet.                           - Continue present medications.                           - No aspirin, ibuprofen, naproxen, or other                            non-steroidal anti-inflammatory drugs for 3 weeks                            after polyp removal.                           - Await pathology results.                           - Repeat colonoscopy is recommended for                            surveillance. The colonoscopy date will be                            determined after pathology results from today's                            exam become available for review. Beverley Fiedler, MD 04/19/2023 2:50:03 PM This report has been signed electronically.

## 2023-04-20 ENCOUNTER — Telehealth: Payer: Self-pay

## 2023-04-20 NOTE — Telephone Encounter (Signed)
Follow up call to pt, lm for pt to call if having any difficulty with normal activities or eating and drinking.  Also to call if any other questions or concerns.  

## 2023-05-03 ENCOUNTER — Encounter: Payer: Self-pay | Admitting: Internal Medicine

## 2023-07-09 ENCOUNTER — Other Ambulatory Visit: Payer: Self-pay | Admitting: Family Medicine

## 2023-07-10 ENCOUNTER — Other Ambulatory Visit: Payer: Self-pay

## 2023-07-10 ENCOUNTER — Other Ambulatory Visit (HOSPITAL_COMMUNITY): Payer: Self-pay

## 2023-07-10 MED ORDER — TRAZODONE HCL 50 MG PO TABS
50.0000 mg | ORAL_TABLET | Freq: Every evening | ORAL | 0 refills | Status: DC | PRN
  Filled 2023-07-10: qty 90, 90d supply, fill #0

## 2023-07-12 ENCOUNTER — Other Ambulatory Visit (HOSPITAL_COMMUNITY): Payer: Self-pay

## 2023-07-13 ENCOUNTER — Other Ambulatory Visit: Payer: Self-pay

## 2023-11-09 DIAGNOSIS — Z1231 Encounter for screening mammogram for malignant neoplasm of breast: Secondary | ICD-10-CM | POA: Diagnosis not present

## 2023-11-09 DIAGNOSIS — Z01419 Encounter for gynecological examination (general) (routine) without abnormal findings: Secondary | ICD-10-CM | POA: Diagnosis not present

## 2023-11-09 DIAGNOSIS — Z719 Counseling, unspecified: Secondary | ICD-10-CM | POA: Diagnosis not present

## 2023-11-09 DIAGNOSIS — Z9229 Personal history of other drug therapy: Secondary | ICD-10-CM | POA: Diagnosis not present

## 2023-11-12 ENCOUNTER — Other Ambulatory Visit: Payer: Self-pay

## 2023-11-12 ENCOUNTER — Other Ambulatory Visit (HOSPITAL_COMMUNITY): Payer: Self-pay

## 2023-11-12 ENCOUNTER — Other Ambulatory Visit: Payer: Self-pay | Admitting: Family Medicine

## 2023-11-12 MED ORDER — NORETHINDRONE ACET-ETHINYL EST 1-20 MG-MCG PO TABS
1.0000 | ORAL_TABLET | Freq: Every day | ORAL | 4 refills | Status: AC
  Filled 2023-11-12: qty 84, 84d supply, fill #0
  Filled 2024-05-23: qty 84, 84d supply, fill #1

## 2023-11-12 MED ORDER — TRAZODONE HCL 50 MG PO TABS
50.0000 mg | ORAL_TABLET | Freq: Every evening | ORAL | 0 refills | Status: AC | PRN
  Filled 2023-11-12: qty 90, 90d supply, fill #0

## 2023-11-13 ENCOUNTER — Other Ambulatory Visit: Payer: Self-pay

## 2023-11-14 ENCOUNTER — Other Ambulatory Visit (HOSPITAL_COMMUNITY): Payer: Self-pay

## 2024-02-15 ENCOUNTER — Other Ambulatory Visit (HOSPITAL_COMMUNITY): Payer: Self-pay

## 2024-02-15 ENCOUNTER — Other Ambulatory Visit: Payer: Self-pay

## 2024-02-15 DIAGNOSIS — F411 Generalized anxiety disorder: Secondary | ICD-10-CM | POA: Diagnosis not present

## 2024-02-15 DIAGNOSIS — F1010 Alcohol abuse, uncomplicated: Secondary | ICD-10-CM | POA: Diagnosis not present

## 2024-02-15 DIAGNOSIS — F331 Major depressive disorder, recurrent, moderate: Secondary | ICD-10-CM | POA: Diagnosis not present

## 2024-02-15 MED ORDER — GABAPENTIN 100 MG PO CAPS
ORAL_CAPSULE | ORAL | 0 refills | Status: DC
  Filled 2024-02-15: qty 90, 30d supply, fill #0

## 2024-02-15 MED ORDER — NALTREXONE HCL 50 MG PO TABS
ORAL_TABLET | ORAL | 0 refills | Status: AC
  Filled 2024-02-15: qty 30, 30d supply, fill #0

## 2024-03-07 ENCOUNTER — Other Ambulatory Visit (HOSPITAL_COMMUNITY): Payer: Self-pay

## 2024-03-07 ENCOUNTER — Other Ambulatory Visit: Payer: Self-pay

## 2024-03-07 DIAGNOSIS — F1010 Alcohol abuse, uncomplicated: Secondary | ICD-10-CM | POA: Diagnosis not present

## 2024-03-07 DIAGNOSIS — F331 Major depressive disorder, recurrent, moderate: Secondary | ICD-10-CM | POA: Diagnosis not present

## 2024-03-07 DIAGNOSIS — F411 Generalized anxiety disorder: Secondary | ICD-10-CM | POA: Diagnosis not present

## 2024-03-07 MED ORDER — GABAPENTIN 100 MG PO CAPS
200.0000 mg | ORAL_CAPSULE | ORAL | 0 refills | Status: DC
  Filled 2024-03-07: qty 60, 30d supply, fill #0

## 2024-03-07 MED ORDER — GABAPENTIN 300 MG PO CAPS
300.0000 mg | ORAL_CAPSULE | Freq: Every day | ORAL | 0 refills | Status: DC
  Filled 2024-03-07: qty 30, 30d supply, fill #0

## 2024-03-14 DIAGNOSIS — F1020 Alcohol dependence, uncomplicated: Secondary | ICD-10-CM | POA: Diagnosis not present

## 2024-04-11 ENCOUNTER — Other Ambulatory Visit (HOSPITAL_COMMUNITY): Payer: Self-pay

## 2024-04-11 ENCOUNTER — Other Ambulatory Visit: Payer: Self-pay

## 2024-04-11 DIAGNOSIS — F411 Generalized anxiety disorder: Secondary | ICD-10-CM | POA: Diagnosis not present

## 2024-04-11 DIAGNOSIS — F1020 Alcohol dependence, uncomplicated: Secondary | ICD-10-CM | POA: Diagnosis not present

## 2024-04-11 DIAGNOSIS — F331 Major depressive disorder, recurrent, moderate: Secondary | ICD-10-CM | POA: Diagnosis not present

## 2024-04-11 DIAGNOSIS — F1010 Alcohol abuse, uncomplicated: Secondary | ICD-10-CM | POA: Diagnosis not present

## 2024-04-11 MED ORDER — SERTRALINE HCL 50 MG PO TABS
50.0000 mg | ORAL_TABLET | Freq: Every day | ORAL | 0 refills | Status: DC
  Filled 2024-04-11: qty 30, 30d supply, fill #0

## 2024-04-11 MED ORDER — GABAPENTIN 300 MG PO CAPS
300.0000 mg | ORAL_CAPSULE | Freq: Every day | ORAL | 1 refills | Status: AC
  Filled 2024-04-11: qty 30, 30d supply, fill #0

## 2024-04-11 MED ORDER — GABAPENTIN 100 MG PO CAPS
200.0000 mg | ORAL_CAPSULE | Freq: Every morning | ORAL | 1 refills | Status: AC
  Filled 2024-04-11: qty 60, 30d supply, fill #0

## 2024-04-25 DIAGNOSIS — F1020 Alcohol dependence, uncomplicated: Secondary | ICD-10-CM | POA: Diagnosis not present

## 2024-05-09 DIAGNOSIS — F1020 Alcohol dependence, uncomplicated: Secondary | ICD-10-CM | POA: Diagnosis not present

## 2024-05-23 ENCOUNTER — Other Ambulatory Visit (HOSPITAL_COMMUNITY): Payer: Self-pay

## 2024-05-23 ENCOUNTER — Other Ambulatory Visit: Payer: Self-pay

## 2024-05-23 DIAGNOSIS — F1020 Alcohol dependence, uncomplicated: Secondary | ICD-10-CM | POA: Diagnosis not present

## 2024-05-23 DIAGNOSIS — F1010 Alcohol abuse, uncomplicated: Secondary | ICD-10-CM | POA: Diagnosis not present

## 2024-05-23 DIAGNOSIS — F331 Major depressive disorder, recurrent, moderate: Secondary | ICD-10-CM | POA: Diagnosis not present

## 2024-05-23 DIAGNOSIS — F411 Generalized anxiety disorder: Secondary | ICD-10-CM | POA: Diagnosis not present

## 2024-05-23 MED ORDER — SERTRALINE HCL 50 MG PO TABS
50.0000 mg | ORAL_TABLET | Freq: Every day | ORAL | 2 refills | Status: AC
  Filled 2024-05-23: qty 30, 30d supply, fill #0

## 2024-05-23 MED ORDER — GABAPENTIN 300 MG PO CAPS
300.0000 mg | ORAL_CAPSULE | Freq: Every day | ORAL | 1 refills | Status: AC
  Filled 2024-05-23: qty 30, 30d supply, fill #0

## 2024-06-20 ENCOUNTER — Other Ambulatory Visit (HOSPITAL_COMMUNITY): Payer: Self-pay

## 2024-06-20 DIAGNOSIS — F411 Generalized anxiety disorder: Secondary | ICD-10-CM | POA: Diagnosis not present

## 2024-06-20 DIAGNOSIS — F1010 Alcohol abuse, uncomplicated: Secondary | ICD-10-CM | POA: Diagnosis not present

## 2024-06-20 DIAGNOSIS — F331 Major depressive disorder, recurrent, moderate: Secondary | ICD-10-CM | POA: Diagnosis not present

## 2024-06-20 MED ORDER — NALTREXONE HCL 50 MG PO TABS
25.0000 mg | ORAL_TABLET | Freq: Every day | ORAL | 0 refills | Status: AC
  Filled 2024-06-20: qty 30, 60d supply, fill #0

## 2024-06-20 MED ORDER — GABAPENTIN 100 MG PO CAPS
200.0000 mg | ORAL_CAPSULE | Freq: Every morning | ORAL | 1 refills | Status: AC
  Filled 2024-06-20: qty 60, 30d supply, fill #0

## 2024-06-20 MED ORDER — GABAPENTIN 300 MG PO CAPS
300.0000 mg | ORAL_CAPSULE | Freq: Every day | ORAL | 1 refills | Status: AC
  Filled 2024-06-20: qty 30, 30d supply, fill #0

## 2024-06-20 MED ORDER — ONDANSETRON 4 MG PO TBDP
4.0000 mg | ORAL_TABLET | Freq: Three times a day (TID) | ORAL | 0 refills | Status: AC | PRN
  Filled 2024-06-20: qty 21, 7d supply, fill #0

## 2024-07-04 DIAGNOSIS — F1020 Alcohol dependence, uncomplicated: Secondary | ICD-10-CM | POA: Diagnosis not present
